# Patient Record
Sex: Female | Born: 1935 | Race: White | Hispanic: No | Marital: Married | Smoking: Never smoker
Health system: Southern US, Community
[De-identification: ages and names within clinical notes are randomized; demographics above are authoritative.]

## PROBLEM LIST (undated history)

## (undated) DIAGNOSIS — I509 Heart failure, unspecified: Secondary | ICD-10-CM

## (undated) DIAGNOSIS — E119 Type 2 diabetes mellitus without complications: Secondary | ICD-10-CM

## (undated) HISTORY — PX: CARDIAC SURGERY: SHX584

## (undated) HISTORY — PX: HIP SURGERY: SHX245

## (undated) HISTORY — PX: HERNIA REPAIR: SHX51

---

## 1998-04-07 ENCOUNTER — Inpatient Hospital Stay (HOSPITAL_COMMUNITY): Admission: RE | Admit: 1998-04-07 | Discharge: 1998-04-14 | Payer: Self-pay | Admitting: Neurological Surgery

## 1999-02-07 ENCOUNTER — Encounter (HOSPITAL_BASED_OUTPATIENT_CLINIC_OR_DEPARTMENT_OTHER): Payer: Self-pay | Admitting: General Surgery

## 1999-02-09 ENCOUNTER — Ambulatory Visit (HOSPITAL_COMMUNITY): Admission: RE | Admit: 1999-02-09 | Discharge: 1999-02-10 | Payer: Self-pay | Admitting: General Surgery

## 1999-05-22 ENCOUNTER — Other Ambulatory Visit: Admission: RE | Admit: 1999-05-22 | Discharge: 1999-05-22 | Payer: Self-pay | Admitting: Family Medicine

## 2001-09-11 ENCOUNTER — Other Ambulatory Visit: Admission: RE | Admit: 2001-09-11 | Discharge: 2001-09-11 | Payer: Self-pay | Admitting: Family Medicine

## 2008-01-11 ENCOUNTER — Emergency Department (HOSPITAL_COMMUNITY): Admission: EM | Admit: 2008-01-11 | Discharge: 2008-01-11 | Payer: Self-pay | Admitting: Emergency Medicine

## 2011-08-24 LAB — CBC
HCT: 35 — ABNORMAL LOW
Hemoglobin: 12.1
MCHC: 34.6
MCV: 93.2
Platelets: 283
RBC: 3.76 — ABNORMAL LOW
RDW: 12.2
WBC: 8.9

## 2011-08-24 LAB — DIFFERENTIAL
Basophils Absolute: 0
Basophils Relative: 0
Eosinophils Absolute: 0.2
Eosinophils Relative: 2
Lymphocytes Relative: 27
Lymphs Abs: 2.4
Monocytes Absolute: 0.7
Monocytes Relative: 8
Neutro Abs: 5.6
Neutrophils Relative %: 63

## 2011-08-24 LAB — PROTIME-INR
INR: 2.3 — ABNORMAL HIGH
Prothrombin Time: 26.2 — ABNORMAL HIGH

## 2014-12-07 DIAGNOSIS — Z7901 Long term (current) use of anticoagulants: Secondary | ICD-10-CM | POA: Diagnosis not present

## 2014-12-21 DIAGNOSIS — L820 Inflamed seborrheic keratosis: Secondary | ICD-10-CM | POA: Diagnosis not present

## 2014-12-21 DIAGNOSIS — Z85828 Personal history of other malignant neoplasm of skin: Secondary | ICD-10-CM | POA: Diagnosis not present

## 2014-12-21 DIAGNOSIS — L821 Other seborrheic keratosis: Secondary | ICD-10-CM | POA: Diagnosis not present

## 2014-12-21 DIAGNOSIS — L570 Actinic keratosis: Secondary | ICD-10-CM | POA: Diagnosis not present

## 2014-12-28 DIAGNOSIS — D649 Anemia, unspecified: Secondary | ICD-10-CM | POA: Diagnosis not present

## 2014-12-30 DIAGNOSIS — D649 Anemia, unspecified: Secondary | ICD-10-CM | POA: Diagnosis not present

## 2015-01-11 DIAGNOSIS — M109 Gout, unspecified: Secondary | ICD-10-CM | POA: Diagnosis not present

## 2015-01-11 DIAGNOSIS — N184 Chronic kidney disease, stage 4 (severe): Secondary | ICD-10-CM | POA: Diagnosis not present

## 2015-01-11 DIAGNOSIS — I10 Essential (primary) hypertension: Secondary | ICD-10-CM | POA: Diagnosis not present

## 2015-01-11 DIAGNOSIS — E782 Mixed hyperlipidemia: Secondary | ICD-10-CM | POA: Diagnosis not present

## 2015-01-11 DIAGNOSIS — Z7901 Long term (current) use of anticoagulants: Secondary | ICD-10-CM | POA: Diagnosis not present

## 2015-01-11 DIAGNOSIS — E1129 Type 2 diabetes mellitus with other diabetic kidney complication: Secondary | ICD-10-CM | POA: Diagnosis not present

## 2015-01-18 DIAGNOSIS — R791 Abnormal coagulation profile: Secondary | ICD-10-CM | POA: Diagnosis not present

## 2015-02-01 DIAGNOSIS — J069 Acute upper respiratory infection, unspecified: Secondary | ICD-10-CM | POA: Diagnosis not present

## 2015-02-01 DIAGNOSIS — E119 Type 2 diabetes mellitus without complications: Secondary | ICD-10-CM | POA: Diagnosis not present

## 2015-02-01 DIAGNOSIS — R791 Abnormal coagulation profile: Secondary | ICD-10-CM | POA: Diagnosis not present

## 2015-02-15 DIAGNOSIS — Z7901 Long term (current) use of anticoagulants: Secondary | ICD-10-CM | POA: Diagnosis not present

## 2015-03-01 DIAGNOSIS — H2703 Aphakia, bilateral: Secondary | ICD-10-CM | POA: Diagnosis not present

## 2015-03-01 DIAGNOSIS — Z7901 Long term (current) use of anticoagulants: Secondary | ICD-10-CM | POA: Diagnosis not present

## 2015-03-15 DIAGNOSIS — L600 Ingrowing nail: Secondary | ICD-10-CM | POA: Diagnosis not present

## 2015-03-15 DIAGNOSIS — M2040 Other hammer toe(s) (acquired), unspecified foot: Secondary | ICD-10-CM | POA: Diagnosis not present

## 2015-03-15 DIAGNOSIS — E1142 Type 2 diabetes mellitus with diabetic polyneuropathy: Secondary | ICD-10-CM | POA: Diagnosis not present

## 2015-03-25 DIAGNOSIS — H40013 Open angle with borderline findings, low risk, bilateral: Secondary | ICD-10-CM | POA: Diagnosis not present

## 2015-03-25 DIAGNOSIS — H26491 Other secondary cataract, right eye: Secondary | ICD-10-CM | POA: Diagnosis not present

## 2015-03-30 DIAGNOSIS — H2703 Aphakia, bilateral: Secondary | ICD-10-CM | POA: Diagnosis not present

## 2015-04-05 DIAGNOSIS — Z7901 Long term (current) use of anticoagulants: Secondary | ICD-10-CM | POA: Diagnosis not present

## 2015-04-05 DIAGNOSIS — N184 Chronic kidney disease, stage 4 (severe): Secondary | ICD-10-CM | POA: Diagnosis not present

## 2015-04-05 DIAGNOSIS — E782 Mixed hyperlipidemia: Secondary | ICD-10-CM | POA: Diagnosis not present

## 2015-04-05 DIAGNOSIS — Z1389 Encounter for screening for other disorder: Secondary | ICD-10-CM | POA: Diagnosis not present

## 2015-04-05 DIAGNOSIS — Z9181 History of falling: Secondary | ICD-10-CM | POA: Diagnosis not present

## 2015-04-05 DIAGNOSIS — I83029 Varicose veins of left lower extremity with ulcer of unspecified site: Secondary | ICD-10-CM | POA: Diagnosis not present

## 2015-04-05 DIAGNOSIS — M109 Gout, unspecified: Secondary | ICD-10-CM | POA: Diagnosis not present

## 2015-04-05 DIAGNOSIS — I10 Essential (primary) hypertension: Secondary | ICD-10-CM | POA: Diagnosis not present

## 2015-04-05 DIAGNOSIS — E1129 Type 2 diabetes mellitus with other diabetic kidney complication: Secondary | ICD-10-CM | POA: Diagnosis not present

## 2015-04-08 DIAGNOSIS — I83029 Varicose veins of left lower extremity with ulcer of unspecified site: Secondary | ICD-10-CM | POA: Diagnosis not present

## 2015-04-11 DIAGNOSIS — I4891 Unspecified atrial fibrillation: Secondary | ICD-10-CM | POA: Diagnosis not present

## 2015-04-13 DIAGNOSIS — Z6834 Body mass index (BMI) 34.0-34.9, adult: Secondary | ICD-10-CM | POA: Diagnosis not present

## 2015-04-13 DIAGNOSIS — I83029 Varicose veins of left lower extremity with ulcer of unspecified site: Secondary | ICD-10-CM | POA: Diagnosis not present

## 2015-04-18 DIAGNOSIS — I87303 Chronic venous hypertension (idiopathic) without complications of bilateral lower extremity: Secondary | ICD-10-CM | POA: Diagnosis not present

## 2015-04-18 DIAGNOSIS — I83029 Varicose veins of left lower extremity with ulcer of unspecified site: Secondary | ICD-10-CM | POA: Diagnosis not present

## 2015-04-19 DIAGNOSIS — L821 Other seborrheic keratosis: Secondary | ICD-10-CM | POA: Diagnosis not present

## 2015-04-19 DIAGNOSIS — L438 Other lichen planus: Secondary | ICD-10-CM | POA: Diagnosis not present

## 2015-04-19 DIAGNOSIS — L570 Actinic keratosis: Secondary | ICD-10-CM | POA: Diagnosis not present

## 2015-05-12 DIAGNOSIS — I83019 Varicose veins of right lower extremity with ulcer of unspecified site: Secondary | ICD-10-CM | POA: Diagnosis not present

## 2015-05-12 DIAGNOSIS — Z6833 Body mass index (BMI) 33.0-33.9, adult: Secondary | ICD-10-CM | POA: Diagnosis not present

## 2015-05-12 DIAGNOSIS — Z7901 Long term (current) use of anticoagulants: Secondary | ICD-10-CM | POA: Diagnosis not present

## 2015-06-14 DIAGNOSIS — Z7901 Long term (current) use of anticoagulants: Secondary | ICD-10-CM | POA: Diagnosis not present

## 2015-07-12 DIAGNOSIS — R791 Abnormal coagulation profile: Secondary | ICD-10-CM | POA: Diagnosis not present

## 2015-07-19 DIAGNOSIS — R791 Abnormal coagulation profile: Secondary | ICD-10-CM | POA: Diagnosis not present

## 2015-08-03 DIAGNOSIS — Z7901 Long term (current) use of anticoagulants: Secondary | ICD-10-CM | POA: Diagnosis not present

## 2015-08-09 DIAGNOSIS — E782 Mixed hyperlipidemia: Secondary | ICD-10-CM | POA: Diagnosis not present

## 2015-08-09 DIAGNOSIS — Z9181 History of falling: Secondary | ICD-10-CM | POA: Diagnosis not present

## 2015-08-09 DIAGNOSIS — M109 Gout, unspecified: Secondary | ICD-10-CM | POA: Diagnosis not present

## 2015-08-09 DIAGNOSIS — Z139 Encounter for screening, unspecified: Secondary | ICD-10-CM | POA: Diagnosis not present

## 2015-08-09 DIAGNOSIS — E1129 Type 2 diabetes mellitus with other diabetic kidney complication: Secondary | ICD-10-CM | POA: Diagnosis not present

## 2015-08-09 DIAGNOSIS — I10 Essential (primary) hypertension: Secondary | ICD-10-CM | POA: Diagnosis not present

## 2015-08-09 DIAGNOSIS — N184 Chronic kidney disease, stage 4 (severe): Secondary | ICD-10-CM | POA: Diagnosis not present

## 2015-08-09 DIAGNOSIS — Z1389 Encounter for screening for other disorder: Secondary | ICD-10-CM | POA: Diagnosis not present

## 2015-08-12 DIAGNOSIS — I83009 Varicose veins of unspecified lower extremity with ulcer of unspecified site: Secondary | ICD-10-CM | POA: Diagnosis not present

## 2015-08-12 DIAGNOSIS — S80821A Blister (nonthermal), right lower leg, initial encounter: Secondary | ICD-10-CM | POA: Diagnosis not present

## 2015-08-12 DIAGNOSIS — Z6835 Body mass index (BMI) 35.0-35.9, adult: Secondary | ICD-10-CM | POA: Diagnosis not present

## 2015-08-16 DIAGNOSIS — L98499 Non-pressure chronic ulcer of skin of other sites with unspecified severity: Secondary | ICD-10-CM | POA: Diagnosis not present

## 2015-08-16 DIAGNOSIS — I83009 Varicose veins of unspecified lower extremity with ulcer of unspecified site: Secondary | ICD-10-CM | POA: Diagnosis not present

## 2015-08-16 DIAGNOSIS — Z6835 Body mass index (BMI) 35.0-35.9, adult: Secondary | ICD-10-CM | POA: Diagnosis not present

## 2015-08-19 DIAGNOSIS — L98499 Non-pressure chronic ulcer of skin of other sites with unspecified severity: Secondary | ICD-10-CM | POA: Diagnosis not present

## 2015-08-19 DIAGNOSIS — I83009 Varicose veins of unspecified lower extremity with ulcer of unspecified site: Secondary | ICD-10-CM | POA: Diagnosis not present

## 2015-08-19 DIAGNOSIS — Z6835 Body mass index (BMI) 35.0-35.9, adult: Secondary | ICD-10-CM | POA: Diagnosis not present

## 2015-08-24 DIAGNOSIS — I83029 Varicose veins of left lower extremity with ulcer of unspecified site: Secondary | ICD-10-CM | POA: Diagnosis not present

## 2015-08-24 DIAGNOSIS — I83019 Varicose veins of right lower extremity with ulcer of unspecified site: Secondary | ICD-10-CM | POA: Diagnosis not present

## 2015-08-24 DIAGNOSIS — Z6835 Body mass index (BMI) 35.0-35.9, adult: Secondary | ICD-10-CM | POA: Diagnosis not present

## 2015-08-29 DIAGNOSIS — R269 Unspecified abnormalities of gait and mobility: Secondary | ICD-10-CM | POA: Diagnosis not present

## 2015-08-29 DIAGNOSIS — Z6835 Body mass index (BMI) 35.0-35.9, adult: Secondary | ICD-10-CM | POA: Diagnosis not present

## 2015-08-29 DIAGNOSIS — I83029 Varicose veins of left lower extremity with ulcer of unspecified site: Secondary | ICD-10-CM | POA: Diagnosis not present

## 2015-08-29 DIAGNOSIS — I83019 Varicose veins of right lower extremity with ulcer of unspecified site: Secondary | ICD-10-CM | POA: Diagnosis not present

## 2015-09-05 DIAGNOSIS — R791 Abnormal coagulation profile: Secondary | ICD-10-CM | POA: Diagnosis not present

## 2015-09-05 DIAGNOSIS — K625 Hemorrhage of anus and rectum: Secondary | ICD-10-CM | POA: Diagnosis not present

## 2015-09-05 DIAGNOSIS — D509 Iron deficiency anemia, unspecified: Secondary | ICD-10-CM | POA: Diagnosis not present

## 2015-09-05 DIAGNOSIS — I4891 Unspecified atrial fibrillation: Secondary | ICD-10-CM | POA: Diagnosis not present

## 2015-09-05 DIAGNOSIS — Z6835 Body mass index (BMI) 35.0-35.9, adult: Secondary | ICD-10-CM | POA: Diagnosis not present

## 2015-09-12 DIAGNOSIS — Z23 Encounter for immunization: Secondary | ICD-10-CM | POA: Diagnosis not present

## 2015-09-12 DIAGNOSIS — I4891 Unspecified atrial fibrillation: Secondary | ICD-10-CM | POA: Diagnosis not present

## 2015-09-12 DIAGNOSIS — R791 Abnormal coagulation profile: Secondary | ICD-10-CM | POA: Diagnosis not present

## 2015-09-12 DIAGNOSIS — D509 Iron deficiency anemia, unspecified: Secondary | ICD-10-CM | POA: Diagnosis not present

## 2015-09-12 DIAGNOSIS — K625 Hemorrhage of anus and rectum: Secondary | ICD-10-CM | POA: Diagnosis not present

## 2015-09-27 DIAGNOSIS — L97911 Non-pressure chronic ulcer of unspecified part of right lower leg limited to breakdown of skin: Secondary | ICD-10-CM | POA: Diagnosis not present

## 2015-09-27 DIAGNOSIS — R791 Abnormal coagulation profile: Secondary | ICD-10-CM | POA: Diagnosis not present

## 2015-10-11 DIAGNOSIS — R791 Abnormal coagulation profile: Secondary | ICD-10-CM | POA: Diagnosis not present

## 2015-10-17 DIAGNOSIS — I110 Hypertensive heart disease with heart failure: Secondary | ICD-10-CM | POA: Diagnosis not present

## 2015-10-17 DIAGNOSIS — I480 Paroxysmal atrial fibrillation: Secondary | ICD-10-CM | POA: Diagnosis not present

## 2015-10-17 DIAGNOSIS — I5032 Chronic diastolic (congestive) heart failure: Secondary | ICD-10-CM | POA: Diagnosis not present

## 2015-10-17 DIAGNOSIS — N183 Chronic kidney disease, stage 3 (moderate): Secondary | ICD-10-CM | POA: Diagnosis not present

## 2015-10-17 DIAGNOSIS — Z7901 Long term (current) use of anticoagulants: Secondary | ICD-10-CM | POA: Diagnosis not present

## 2015-10-26 DIAGNOSIS — R791 Abnormal coagulation profile: Secondary | ICD-10-CM | POA: Diagnosis not present

## 2015-10-26 DIAGNOSIS — L97929 Non-pressure chronic ulcer of unspecified part of left lower leg with unspecified severity: Secondary | ICD-10-CM | POA: Diagnosis not present

## 2015-10-26 DIAGNOSIS — I87303 Chronic venous hypertension (idiopathic) without complications of bilateral lower extremity: Secondary | ICD-10-CM | POA: Diagnosis not present

## 2015-10-26 DIAGNOSIS — L97919 Non-pressure chronic ulcer of unspecified part of right lower leg with unspecified severity: Secondary | ICD-10-CM | POA: Diagnosis not present

## 2015-10-31 DIAGNOSIS — D044 Carcinoma in situ of skin of scalp and neck: Secondary | ICD-10-CM | POA: Diagnosis not present

## 2015-10-31 DIAGNOSIS — L570 Actinic keratosis: Secondary | ICD-10-CM | POA: Diagnosis not present

## 2015-10-31 DIAGNOSIS — D0439 Carcinoma in situ of skin of other parts of face: Secondary | ICD-10-CM | POA: Diagnosis not present

## 2015-11-02 DIAGNOSIS — Z6834 Body mass index (BMI) 34.0-34.9, adult: Secondary | ICD-10-CM | POA: Diagnosis not present

## 2015-11-02 DIAGNOSIS — I87303 Chronic venous hypertension (idiopathic) without complications of bilateral lower extremity: Secondary | ICD-10-CM | POA: Diagnosis not present

## 2015-11-02 DIAGNOSIS — E1129 Type 2 diabetes mellitus with other diabetic kidney complication: Secondary | ICD-10-CM | POA: Diagnosis not present

## 2015-11-02 DIAGNOSIS — L97929 Non-pressure chronic ulcer of unspecified part of left lower leg with unspecified severity: Secondary | ICD-10-CM | POA: Diagnosis not present

## 2015-11-09 DIAGNOSIS — S0083XA Contusion of other part of head, initial encounter: Secondary | ICD-10-CM | POA: Diagnosis not present

## 2015-11-09 DIAGNOSIS — L97929 Non-pressure chronic ulcer of unspecified part of left lower leg with unspecified severity: Secondary | ICD-10-CM | POA: Diagnosis not present

## 2015-11-09 DIAGNOSIS — R51 Headache: Secondary | ICD-10-CM | POA: Diagnosis not present

## 2015-11-09 DIAGNOSIS — S0990XA Unspecified injury of head, initial encounter: Secondary | ICD-10-CM | POA: Diagnosis not present

## 2015-11-09 DIAGNOSIS — S0181XA Laceration without foreign body of other part of head, initial encounter: Secondary | ICD-10-CM | POA: Diagnosis not present

## 2015-11-09 DIAGNOSIS — Z6834 Body mass index (BMI) 34.0-34.9, adult: Secondary | ICD-10-CM | POA: Diagnosis not present

## 2015-11-09 DIAGNOSIS — L97911 Non-pressure chronic ulcer of unspecified part of right lower leg limited to breakdown of skin: Secondary | ICD-10-CM | POA: Diagnosis not present

## 2015-11-09 DIAGNOSIS — S2231XA Fracture of one rib, right side, initial encounter for closed fracture: Secondary | ICD-10-CM | POA: Diagnosis not present

## 2015-11-09 DIAGNOSIS — S8992XA Unspecified injury of left lower leg, initial encounter: Secondary | ICD-10-CM | POA: Diagnosis not present

## 2015-11-15 DIAGNOSIS — S2231XA Fracture of one rib, right side, initial encounter for closed fracture: Secondary | ICD-10-CM | POA: Diagnosis not present

## 2015-11-15 DIAGNOSIS — S8001XA Contusion of right knee, initial encounter: Secondary | ICD-10-CM | POA: Diagnosis not present

## 2015-11-15 DIAGNOSIS — S0181XA Laceration without foreign body of other part of head, initial encounter: Secondary | ICD-10-CM | POA: Diagnosis not present

## 2015-11-18 DIAGNOSIS — S0181XA Laceration without foreign body of other part of head, initial encounter: Secondary | ICD-10-CM | POA: Diagnosis not present

## 2015-11-18 DIAGNOSIS — R791 Abnormal coagulation profile: Secondary | ICD-10-CM | POA: Diagnosis not present

## 2015-11-18 DIAGNOSIS — H578 Other specified disorders of eye and adnexa: Secondary | ICD-10-CM | POA: Diagnosis not present

## 2015-11-18 DIAGNOSIS — S2231XA Fracture of one rib, right side, initial encounter for closed fracture: Secondary | ICD-10-CM | POA: Diagnosis not present

## 2015-11-25 DIAGNOSIS — Z7901 Long term (current) use of anticoagulants: Secondary | ICD-10-CM | POA: Diagnosis not present

## 2015-12-09 DIAGNOSIS — I87303 Chronic venous hypertension (idiopathic) without complications of bilateral lower extremity: Secondary | ICD-10-CM | POA: Diagnosis not present

## 2015-12-09 DIAGNOSIS — I83009 Varicose veins of unspecified lower extremity with ulcer of unspecified site: Secondary | ICD-10-CM | POA: Diagnosis not present

## 2015-12-16 DIAGNOSIS — E1129 Type 2 diabetes mellitus with other diabetic kidney complication: Secondary | ICD-10-CM | POA: Diagnosis not present

## 2015-12-16 DIAGNOSIS — I83009 Varicose veins of unspecified lower extremity with ulcer of unspecified site: Secondary | ICD-10-CM | POA: Diagnosis not present

## 2015-12-23 DIAGNOSIS — L03116 Cellulitis of left lower limb: Secondary | ICD-10-CM | POA: Diagnosis not present

## 2015-12-23 DIAGNOSIS — I83009 Varicose veins of unspecified lower extremity with ulcer of unspecified site: Secondary | ICD-10-CM | POA: Diagnosis not present

## 2015-12-23 DIAGNOSIS — I87303 Chronic venous hypertension (idiopathic) without complications of bilateral lower extremity: Secondary | ICD-10-CM | POA: Diagnosis not present

## 2015-12-23 DIAGNOSIS — E669 Obesity, unspecified: Secondary | ICD-10-CM | POA: Diagnosis not present

## 2015-12-28 DIAGNOSIS — E1129 Type 2 diabetes mellitus with other diabetic kidney complication: Secondary | ICD-10-CM | POA: Diagnosis not present

## 2015-12-28 DIAGNOSIS — E782 Mixed hyperlipidemia: Secondary | ICD-10-CM | POA: Diagnosis not present

## 2015-12-28 DIAGNOSIS — Z79899 Other long term (current) drug therapy: Secondary | ICD-10-CM | POA: Diagnosis not present

## 2015-12-28 DIAGNOSIS — Z7901 Long term (current) use of anticoagulants: Secondary | ICD-10-CM | POA: Diagnosis not present

## 2015-12-28 DIAGNOSIS — M109 Gout, unspecified: Secondary | ICD-10-CM | POA: Diagnosis not present

## 2015-12-30 DIAGNOSIS — E782 Mixed hyperlipidemia: Secondary | ICD-10-CM | POA: Diagnosis not present

## 2015-12-30 DIAGNOSIS — I10 Essential (primary) hypertension: Secondary | ICD-10-CM | POA: Diagnosis not present

## 2015-12-30 DIAGNOSIS — E1129 Type 2 diabetes mellitus with other diabetic kidney complication: Secondary | ICD-10-CM | POA: Diagnosis not present

## 2015-12-30 DIAGNOSIS — I83009 Varicose veins of unspecified lower extremity with ulcer of unspecified site: Secondary | ICD-10-CM | POA: Diagnosis not present

## 2015-12-30 DIAGNOSIS — N183 Chronic kidney disease, stage 3 (moderate): Secondary | ICD-10-CM | POA: Diagnosis not present

## 2016-01-06 DIAGNOSIS — I83009 Varicose veins of unspecified lower extremity with ulcer of unspecified site: Secondary | ICD-10-CM | POA: Diagnosis not present

## 2016-01-09 DIAGNOSIS — I83009 Varicose veins of unspecified lower extremity with ulcer of unspecified site: Secondary | ICD-10-CM | POA: Diagnosis not present

## 2016-01-16 DIAGNOSIS — I87303 Chronic venous hypertension (idiopathic) without complications of bilateral lower extremity: Secondary | ICD-10-CM | POA: Diagnosis not present

## 2016-01-16 DIAGNOSIS — L97911 Non-pressure chronic ulcer of unspecified part of right lower leg limited to breakdown of skin: Secondary | ICD-10-CM | POA: Diagnosis not present

## 2016-01-23 DIAGNOSIS — I83009 Varicose veins of unspecified lower extremity with ulcer of unspecified site: Secondary | ICD-10-CM | POA: Diagnosis not present

## 2016-01-23 DIAGNOSIS — I87303 Chronic venous hypertension (idiopathic) without complications of bilateral lower extremity: Secondary | ICD-10-CM | POA: Diagnosis not present

## 2016-01-31 DIAGNOSIS — R791 Abnormal coagulation profile: Secondary | ICD-10-CM | POA: Diagnosis not present

## 2016-01-31 DIAGNOSIS — I87303 Chronic venous hypertension (idiopathic) without complications of bilateral lower extremity: Secondary | ICD-10-CM | POA: Diagnosis not present

## 2016-02-02 DIAGNOSIS — E119 Type 2 diabetes mellitus without complications: Secondary | ICD-10-CM | POA: Diagnosis not present

## 2016-02-03 DIAGNOSIS — I83009 Varicose veins of unspecified lower extremity with ulcer of unspecified site: Secondary | ICD-10-CM | POA: Diagnosis not present

## 2016-02-03 DIAGNOSIS — I87303 Chronic venous hypertension (idiopathic) without complications of bilateral lower extremity: Secondary | ICD-10-CM | POA: Diagnosis not present

## 2016-02-06 DIAGNOSIS — I87303 Chronic venous hypertension (idiopathic) without complications of bilateral lower extremity: Secondary | ICD-10-CM | POA: Diagnosis not present

## 2016-02-06 DIAGNOSIS — I83009 Varicose veins of unspecified lower extremity with ulcer of unspecified site: Secondary | ICD-10-CM | POA: Diagnosis not present

## 2016-02-06 DIAGNOSIS — M25462 Effusion, left knee: Secondary | ICD-10-CM | POA: Diagnosis not present

## 2016-02-06 DIAGNOSIS — M25561 Pain in right knee: Secondary | ICD-10-CM | POA: Diagnosis not present

## 2016-02-06 DIAGNOSIS — M25461 Effusion, right knee: Secondary | ICD-10-CM | POA: Diagnosis not present

## 2016-02-06 DIAGNOSIS — W19XXXA Unspecified fall, initial encounter: Secondary | ICD-10-CM | POA: Diagnosis not present

## 2016-02-06 DIAGNOSIS — M170 Bilateral primary osteoarthritis of knee: Secondary | ICD-10-CM | POA: Diagnosis not present

## 2016-02-10 DIAGNOSIS — I87303 Chronic venous hypertension (idiopathic) without complications of bilateral lower extremity: Secondary | ICD-10-CM | POA: Diagnosis not present

## 2016-02-10 DIAGNOSIS — Z9181 History of falling: Secondary | ICD-10-CM | POA: Diagnosis not present

## 2016-02-10 DIAGNOSIS — Z952 Presence of prosthetic heart valve: Secondary | ICD-10-CM | POA: Diagnosis not present

## 2016-02-10 DIAGNOSIS — E1122 Type 2 diabetes mellitus with diabetic chronic kidney disease: Secondary | ICD-10-CM | POA: Diagnosis not present

## 2016-02-10 DIAGNOSIS — I130 Hypertensive heart and chronic kidney disease with heart failure and stage 1 through stage 4 chronic kidney disease, or unspecified chronic kidney disease: Secondary | ICD-10-CM | POA: Diagnosis not present

## 2016-02-10 DIAGNOSIS — I83218 Varicose veins of right lower extremity with both ulcer of other part of lower extremity and inflammation: Secondary | ICD-10-CM | POA: Diagnosis not present

## 2016-02-10 DIAGNOSIS — Z7901 Long term (current) use of anticoagulants: Secondary | ICD-10-CM | POA: Diagnosis not present

## 2016-02-10 DIAGNOSIS — L97812 Non-pressure chronic ulcer of other part of right lower leg with fat layer exposed: Secondary | ICD-10-CM | POA: Diagnosis not present

## 2016-02-10 DIAGNOSIS — E1143 Type 2 diabetes mellitus with diabetic autonomic (poly)neuropathy: Secondary | ICD-10-CM | POA: Diagnosis not present

## 2016-02-10 DIAGNOSIS — I83009 Varicose veins of unspecified lower extremity with ulcer of unspecified site: Secondary | ICD-10-CM | POA: Diagnosis not present

## 2016-02-10 DIAGNOSIS — I2510 Atherosclerotic heart disease of native coronary artery without angina pectoris: Secondary | ICD-10-CM | POA: Diagnosis not present

## 2016-02-10 DIAGNOSIS — L97822 Non-pressure chronic ulcer of other part of left lower leg with fat layer exposed: Secondary | ICD-10-CM | POA: Diagnosis not present

## 2016-02-10 DIAGNOSIS — N183 Chronic kidney disease, stage 3 (moderate): Secondary | ICD-10-CM | POA: Diagnosis not present

## 2016-02-10 DIAGNOSIS — I482 Chronic atrial fibrillation: Secondary | ICD-10-CM | POA: Diagnosis not present

## 2016-02-10 DIAGNOSIS — I83228 Varicose veins of left lower extremity with both ulcer of other part of lower extremity and inflammation: Secondary | ICD-10-CM | POA: Diagnosis not present

## 2016-02-10 DIAGNOSIS — I5022 Chronic systolic (congestive) heart failure: Secondary | ICD-10-CM | POA: Diagnosis not present

## 2016-02-14 DIAGNOSIS — I5022 Chronic systolic (congestive) heart failure: Secondary | ICD-10-CM | POA: Diagnosis not present

## 2016-02-14 DIAGNOSIS — I482 Chronic atrial fibrillation: Secondary | ICD-10-CM | POA: Diagnosis not present

## 2016-02-14 DIAGNOSIS — I83218 Varicose veins of right lower extremity with both ulcer of other part of lower extremity and inflammation: Secondary | ICD-10-CM | POA: Diagnosis not present

## 2016-02-14 DIAGNOSIS — L97812 Non-pressure chronic ulcer of other part of right lower leg with fat layer exposed: Secondary | ICD-10-CM | POA: Diagnosis not present

## 2016-02-14 DIAGNOSIS — I2510 Atherosclerotic heart disease of native coronary artery without angina pectoris: Secondary | ICD-10-CM | POA: Diagnosis not present

## 2016-02-14 DIAGNOSIS — I83228 Varicose veins of left lower extremity with both ulcer of other part of lower extremity and inflammation: Secondary | ICD-10-CM | POA: Diagnosis not present

## 2016-02-14 DIAGNOSIS — E1122 Type 2 diabetes mellitus with diabetic chronic kidney disease: Secondary | ICD-10-CM | POA: Diagnosis not present

## 2016-02-14 DIAGNOSIS — L97822 Non-pressure chronic ulcer of other part of left lower leg with fat layer exposed: Secondary | ICD-10-CM | POA: Diagnosis not present

## 2016-02-14 DIAGNOSIS — I130 Hypertensive heart and chronic kidney disease with heart failure and stage 1 through stage 4 chronic kidney disease, or unspecified chronic kidney disease: Secondary | ICD-10-CM | POA: Diagnosis not present

## 2016-02-14 DIAGNOSIS — Z7901 Long term (current) use of anticoagulants: Secondary | ICD-10-CM | POA: Diagnosis not present

## 2016-02-14 DIAGNOSIS — Z9181 History of falling: Secondary | ICD-10-CM | POA: Diagnosis not present

## 2016-02-14 DIAGNOSIS — E1143 Type 2 diabetes mellitus with diabetic autonomic (poly)neuropathy: Secondary | ICD-10-CM | POA: Diagnosis not present

## 2016-02-14 DIAGNOSIS — N183 Chronic kidney disease, stage 3 (moderate): Secondary | ICD-10-CM | POA: Diagnosis not present

## 2016-02-14 DIAGNOSIS — Z952 Presence of prosthetic heart valve: Secondary | ICD-10-CM | POA: Diagnosis not present

## 2016-02-15 DIAGNOSIS — Z7901 Long term (current) use of anticoagulants: Secondary | ICD-10-CM | POA: Diagnosis not present

## 2016-02-16 DIAGNOSIS — E1143 Type 2 diabetes mellitus with diabetic autonomic (poly)neuropathy: Secondary | ICD-10-CM | POA: Diagnosis not present

## 2016-02-16 DIAGNOSIS — I83228 Varicose veins of left lower extremity with both ulcer of other part of lower extremity and inflammation: Secondary | ICD-10-CM | POA: Diagnosis not present

## 2016-02-16 DIAGNOSIS — E1122 Type 2 diabetes mellitus with diabetic chronic kidney disease: Secondary | ICD-10-CM | POA: Diagnosis not present

## 2016-02-16 DIAGNOSIS — L97822 Non-pressure chronic ulcer of other part of left lower leg with fat layer exposed: Secondary | ICD-10-CM | POA: Diagnosis not present

## 2016-02-16 DIAGNOSIS — Z952 Presence of prosthetic heart valve: Secondary | ICD-10-CM | POA: Diagnosis not present

## 2016-02-16 DIAGNOSIS — I83218 Varicose veins of right lower extremity with both ulcer of other part of lower extremity and inflammation: Secondary | ICD-10-CM | POA: Diagnosis not present

## 2016-02-16 DIAGNOSIS — Z7901 Long term (current) use of anticoagulants: Secondary | ICD-10-CM | POA: Diagnosis not present

## 2016-02-16 DIAGNOSIS — L97812 Non-pressure chronic ulcer of other part of right lower leg with fat layer exposed: Secondary | ICD-10-CM | POA: Diagnosis not present

## 2016-02-16 DIAGNOSIS — I482 Chronic atrial fibrillation: Secondary | ICD-10-CM | POA: Diagnosis not present

## 2016-02-16 DIAGNOSIS — I5022 Chronic systolic (congestive) heart failure: Secondary | ICD-10-CM | POA: Diagnosis not present

## 2016-02-16 DIAGNOSIS — N183 Chronic kidney disease, stage 3 (moderate): Secondary | ICD-10-CM | POA: Diagnosis not present

## 2016-02-16 DIAGNOSIS — I2510 Atherosclerotic heart disease of native coronary artery without angina pectoris: Secondary | ICD-10-CM | POA: Diagnosis not present

## 2016-02-16 DIAGNOSIS — I130 Hypertensive heart and chronic kidney disease with heart failure and stage 1 through stage 4 chronic kidney disease, or unspecified chronic kidney disease: Secondary | ICD-10-CM | POA: Diagnosis not present

## 2016-02-16 DIAGNOSIS — Z9181 History of falling: Secondary | ICD-10-CM | POA: Diagnosis not present

## 2016-02-21 DIAGNOSIS — N183 Chronic kidney disease, stage 3 (moderate): Secondary | ICD-10-CM | POA: Diagnosis not present

## 2016-02-21 DIAGNOSIS — I482 Chronic atrial fibrillation: Secondary | ICD-10-CM | POA: Diagnosis not present

## 2016-02-21 DIAGNOSIS — I83228 Varicose veins of left lower extremity with both ulcer of other part of lower extremity and inflammation: Secondary | ICD-10-CM | POA: Diagnosis not present

## 2016-02-21 DIAGNOSIS — L97812 Non-pressure chronic ulcer of other part of right lower leg with fat layer exposed: Secondary | ICD-10-CM | POA: Diagnosis not present

## 2016-02-21 DIAGNOSIS — Z7901 Long term (current) use of anticoagulants: Secondary | ICD-10-CM | POA: Diagnosis not present

## 2016-02-21 DIAGNOSIS — I83218 Varicose veins of right lower extremity with both ulcer of other part of lower extremity and inflammation: Secondary | ICD-10-CM | POA: Diagnosis not present

## 2016-02-21 DIAGNOSIS — E1122 Type 2 diabetes mellitus with diabetic chronic kidney disease: Secondary | ICD-10-CM | POA: Diagnosis not present

## 2016-02-21 DIAGNOSIS — I130 Hypertensive heart and chronic kidney disease with heart failure and stage 1 through stage 4 chronic kidney disease, or unspecified chronic kidney disease: Secondary | ICD-10-CM | POA: Diagnosis not present

## 2016-02-21 DIAGNOSIS — Z9181 History of falling: Secondary | ICD-10-CM | POA: Diagnosis not present

## 2016-02-21 DIAGNOSIS — Z952 Presence of prosthetic heart valve: Secondary | ICD-10-CM | POA: Diagnosis not present

## 2016-02-21 DIAGNOSIS — E1143 Type 2 diabetes mellitus with diabetic autonomic (poly)neuropathy: Secondary | ICD-10-CM | POA: Diagnosis not present

## 2016-02-21 DIAGNOSIS — I5022 Chronic systolic (congestive) heart failure: Secondary | ICD-10-CM | POA: Diagnosis not present

## 2016-02-21 DIAGNOSIS — I2510 Atherosclerotic heart disease of native coronary artery without angina pectoris: Secondary | ICD-10-CM | POA: Diagnosis not present

## 2016-02-21 DIAGNOSIS — L97822 Non-pressure chronic ulcer of other part of left lower leg with fat layer exposed: Secondary | ICD-10-CM | POA: Diagnosis not present

## 2016-02-24 DIAGNOSIS — I482 Chronic atrial fibrillation: Secondary | ICD-10-CM | POA: Diagnosis not present

## 2016-02-24 DIAGNOSIS — I83218 Varicose veins of right lower extremity with both ulcer of other part of lower extremity and inflammation: Secondary | ICD-10-CM | POA: Diagnosis not present

## 2016-02-24 DIAGNOSIS — I2510 Atherosclerotic heart disease of native coronary artery without angina pectoris: Secondary | ICD-10-CM | POA: Diagnosis not present

## 2016-02-24 DIAGNOSIS — Z952 Presence of prosthetic heart valve: Secondary | ICD-10-CM | POA: Diagnosis not present

## 2016-02-24 DIAGNOSIS — I5022 Chronic systolic (congestive) heart failure: Secondary | ICD-10-CM | POA: Diagnosis not present

## 2016-02-24 DIAGNOSIS — L97822 Non-pressure chronic ulcer of other part of left lower leg with fat layer exposed: Secondary | ICD-10-CM | POA: Diagnosis not present

## 2016-02-24 DIAGNOSIS — I83228 Varicose veins of left lower extremity with both ulcer of other part of lower extremity and inflammation: Secondary | ICD-10-CM | POA: Diagnosis not present

## 2016-02-24 DIAGNOSIS — E1143 Type 2 diabetes mellitus with diabetic autonomic (poly)neuropathy: Secondary | ICD-10-CM | POA: Diagnosis not present

## 2016-02-24 DIAGNOSIS — Z7901 Long term (current) use of anticoagulants: Secondary | ICD-10-CM | POA: Diagnosis not present

## 2016-02-24 DIAGNOSIS — Z9181 History of falling: Secondary | ICD-10-CM | POA: Diagnosis not present

## 2016-02-24 DIAGNOSIS — N183 Chronic kidney disease, stage 3 (moderate): Secondary | ICD-10-CM | POA: Diagnosis not present

## 2016-02-24 DIAGNOSIS — E1122 Type 2 diabetes mellitus with diabetic chronic kidney disease: Secondary | ICD-10-CM | POA: Diagnosis not present

## 2016-02-24 DIAGNOSIS — I130 Hypertensive heart and chronic kidney disease with heart failure and stage 1 through stage 4 chronic kidney disease, or unspecified chronic kidney disease: Secondary | ICD-10-CM | POA: Diagnosis not present

## 2016-02-24 DIAGNOSIS — L97812 Non-pressure chronic ulcer of other part of right lower leg with fat layer exposed: Secondary | ICD-10-CM | POA: Diagnosis not present

## 2016-02-29 DIAGNOSIS — L97812 Non-pressure chronic ulcer of other part of right lower leg with fat layer exposed: Secondary | ICD-10-CM | POA: Diagnosis not present

## 2016-02-29 DIAGNOSIS — Z9181 History of falling: Secondary | ICD-10-CM | POA: Diagnosis not present

## 2016-02-29 DIAGNOSIS — Z7901 Long term (current) use of anticoagulants: Secondary | ICD-10-CM | POA: Diagnosis not present

## 2016-02-29 DIAGNOSIS — I482 Chronic atrial fibrillation: Secondary | ICD-10-CM | POA: Diagnosis not present

## 2016-02-29 DIAGNOSIS — I83228 Varicose veins of left lower extremity with both ulcer of other part of lower extremity and inflammation: Secondary | ICD-10-CM | POA: Diagnosis not present

## 2016-02-29 DIAGNOSIS — L97822 Non-pressure chronic ulcer of other part of left lower leg with fat layer exposed: Secondary | ICD-10-CM | POA: Diagnosis not present

## 2016-02-29 DIAGNOSIS — I5022 Chronic systolic (congestive) heart failure: Secondary | ICD-10-CM | POA: Diagnosis not present

## 2016-02-29 DIAGNOSIS — E1143 Type 2 diabetes mellitus with diabetic autonomic (poly)neuropathy: Secondary | ICD-10-CM | POA: Diagnosis not present

## 2016-02-29 DIAGNOSIS — N183 Chronic kidney disease, stage 3 (moderate): Secondary | ICD-10-CM | POA: Diagnosis not present

## 2016-02-29 DIAGNOSIS — I2510 Atherosclerotic heart disease of native coronary artery without angina pectoris: Secondary | ICD-10-CM | POA: Diagnosis not present

## 2016-02-29 DIAGNOSIS — I130 Hypertensive heart and chronic kidney disease with heart failure and stage 1 through stage 4 chronic kidney disease, or unspecified chronic kidney disease: Secondary | ICD-10-CM | POA: Diagnosis not present

## 2016-02-29 DIAGNOSIS — E1122 Type 2 diabetes mellitus with diabetic chronic kidney disease: Secondary | ICD-10-CM | POA: Diagnosis not present

## 2016-02-29 DIAGNOSIS — Z952 Presence of prosthetic heart valve: Secondary | ICD-10-CM | POA: Diagnosis not present

## 2016-02-29 DIAGNOSIS — I83218 Varicose veins of right lower extremity with both ulcer of other part of lower extremity and inflammation: Secondary | ICD-10-CM | POA: Diagnosis not present

## 2016-03-02 DIAGNOSIS — M79604 Pain in right leg: Secondary | ICD-10-CM | POA: Diagnosis not present

## 2016-03-02 DIAGNOSIS — I890 Lymphedema, not elsewhere classified: Secondary | ICD-10-CM | POA: Diagnosis not present

## 2016-03-05 DIAGNOSIS — I5022 Chronic systolic (congestive) heart failure: Secondary | ICD-10-CM | POA: Diagnosis not present

## 2016-03-05 DIAGNOSIS — E1143 Type 2 diabetes mellitus with diabetic autonomic (poly)neuropathy: Secondary | ICD-10-CM | POA: Diagnosis not present

## 2016-03-05 DIAGNOSIS — I83228 Varicose veins of left lower extremity with both ulcer of other part of lower extremity and inflammation: Secondary | ICD-10-CM | POA: Diagnosis not present

## 2016-03-05 DIAGNOSIS — L97822 Non-pressure chronic ulcer of other part of left lower leg with fat layer exposed: Secondary | ICD-10-CM | POA: Diagnosis not present

## 2016-03-05 DIAGNOSIS — Z952 Presence of prosthetic heart valve: Secondary | ICD-10-CM | POA: Diagnosis not present

## 2016-03-05 DIAGNOSIS — I83218 Varicose veins of right lower extremity with both ulcer of other part of lower extremity and inflammation: Secondary | ICD-10-CM | POA: Diagnosis not present

## 2016-03-05 DIAGNOSIS — I482 Chronic atrial fibrillation: Secondary | ICD-10-CM | POA: Diagnosis not present

## 2016-03-05 DIAGNOSIS — I130 Hypertensive heart and chronic kidney disease with heart failure and stage 1 through stage 4 chronic kidney disease, or unspecified chronic kidney disease: Secondary | ICD-10-CM | POA: Diagnosis not present

## 2016-03-05 DIAGNOSIS — E1122 Type 2 diabetes mellitus with diabetic chronic kidney disease: Secondary | ICD-10-CM | POA: Diagnosis not present

## 2016-03-05 DIAGNOSIS — N183 Chronic kidney disease, stage 3 (moderate): Secondary | ICD-10-CM | POA: Diagnosis not present

## 2016-03-05 DIAGNOSIS — I2510 Atherosclerotic heart disease of native coronary artery without angina pectoris: Secondary | ICD-10-CM | POA: Diagnosis not present

## 2016-03-05 DIAGNOSIS — Z7901 Long term (current) use of anticoagulants: Secondary | ICD-10-CM | POA: Diagnosis not present

## 2016-03-05 DIAGNOSIS — Z9181 History of falling: Secondary | ICD-10-CM | POA: Diagnosis not present

## 2016-03-05 DIAGNOSIS — L97812 Non-pressure chronic ulcer of other part of right lower leg with fat layer exposed: Secondary | ICD-10-CM | POA: Diagnosis not present

## 2016-03-08 DIAGNOSIS — Z9181 History of falling: Secondary | ICD-10-CM | POA: Diagnosis not present

## 2016-03-08 DIAGNOSIS — N183 Chronic kidney disease, stage 3 (moderate): Secondary | ICD-10-CM | POA: Diagnosis not present

## 2016-03-08 DIAGNOSIS — I2510 Atherosclerotic heart disease of native coronary artery without angina pectoris: Secondary | ICD-10-CM | POA: Diagnosis not present

## 2016-03-08 DIAGNOSIS — Z7901 Long term (current) use of anticoagulants: Secondary | ICD-10-CM | POA: Diagnosis not present

## 2016-03-08 DIAGNOSIS — I482 Chronic atrial fibrillation: Secondary | ICD-10-CM | POA: Diagnosis not present

## 2016-03-08 DIAGNOSIS — E1122 Type 2 diabetes mellitus with diabetic chronic kidney disease: Secondary | ICD-10-CM | POA: Diagnosis not present

## 2016-03-08 DIAGNOSIS — L97812 Non-pressure chronic ulcer of other part of right lower leg with fat layer exposed: Secondary | ICD-10-CM | POA: Diagnosis not present

## 2016-03-08 DIAGNOSIS — I83228 Varicose veins of left lower extremity with both ulcer of other part of lower extremity and inflammation: Secondary | ICD-10-CM | POA: Diagnosis not present

## 2016-03-08 DIAGNOSIS — L97822 Non-pressure chronic ulcer of other part of left lower leg with fat layer exposed: Secondary | ICD-10-CM | POA: Diagnosis not present

## 2016-03-08 DIAGNOSIS — I130 Hypertensive heart and chronic kidney disease with heart failure and stage 1 through stage 4 chronic kidney disease, or unspecified chronic kidney disease: Secondary | ICD-10-CM | POA: Diagnosis not present

## 2016-03-08 DIAGNOSIS — E1143 Type 2 diabetes mellitus with diabetic autonomic (poly)neuropathy: Secondary | ICD-10-CM | POA: Diagnosis not present

## 2016-03-08 DIAGNOSIS — I83218 Varicose veins of right lower extremity with both ulcer of other part of lower extremity and inflammation: Secondary | ICD-10-CM | POA: Diagnosis not present

## 2016-03-08 DIAGNOSIS — I5022 Chronic systolic (congestive) heart failure: Secondary | ICD-10-CM | POA: Diagnosis not present

## 2016-03-08 DIAGNOSIS — Z952 Presence of prosthetic heart valve: Secondary | ICD-10-CM | POA: Diagnosis not present

## 2016-03-14 DIAGNOSIS — I5022 Chronic systolic (congestive) heart failure: Secondary | ICD-10-CM | POA: Diagnosis not present

## 2016-03-14 DIAGNOSIS — Z7901 Long term (current) use of anticoagulants: Secondary | ICD-10-CM | POA: Diagnosis not present

## 2016-03-14 DIAGNOSIS — E1143 Type 2 diabetes mellitus with diabetic autonomic (poly)neuropathy: Secondary | ICD-10-CM | POA: Diagnosis not present

## 2016-03-14 DIAGNOSIS — Z9181 History of falling: Secondary | ICD-10-CM | POA: Diagnosis not present

## 2016-03-14 DIAGNOSIS — E1122 Type 2 diabetes mellitus with diabetic chronic kidney disease: Secondary | ICD-10-CM | POA: Diagnosis not present

## 2016-03-14 DIAGNOSIS — N183 Chronic kidney disease, stage 3 (moderate): Secondary | ICD-10-CM | POA: Diagnosis not present

## 2016-03-14 DIAGNOSIS — I2510 Atherosclerotic heart disease of native coronary artery without angina pectoris: Secondary | ICD-10-CM | POA: Diagnosis not present

## 2016-03-14 DIAGNOSIS — L97822 Non-pressure chronic ulcer of other part of left lower leg with fat layer exposed: Secondary | ICD-10-CM | POA: Diagnosis not present

## 2016-03-14 DIAGNOSIS — I482 Chronic atrial fibrillation: Secondary | ICD-10-CM | POA: Diagnosis not present

## 2016-03-14 DIAGNOSIS — I83218 Varicose veins of right lower extremity with both ulcer of other part of lower extremity and inflammation: Secondary | ICD-10-CM | POA: Diagnosis not present

## 2016-03-14 DIAGNOSIS — I130 Hypertensive heart and chronic kidney disease with heart failure and stage 1 through stage 4 chronic kidney disease, or unspecified chronic kidney disease: Secondary | ICD-10-CM | POA: Diagnosis not present

## 2016-03-14 DIAGNOSIS — L97812 Non-pressure chronic ulcer of other part of right lower leg with fat layer exposed: Secondary | ICD-10-CM | POA: Diagnosis not present

## 2016-03-14 DIAGNOSIS — I83228 Varicose veins of left lower extremity with both ulcer of other part of lower extremity and inflammation: Secondary | ICD-10-CM | POA: Diagnosis not present

## 2016-03-14 DIAGNOSIS — Z952 Presence of prosthetic heart valve: Secondary | ICD-10-CM | POA: Diagnosis not present

## 2016-03-21 DIAGNOSIS — I5022 Chronic systolic (congestive) heart failure: Secondary | ICD-10-CM | POA: Diagnosis not present

## 2016-03-21 DIAGNOSIS — I83228 Varicose veins of left lower extremity with both ulcer of other part of lower extremity and inflammation: Secondary | ICD-10-CM | POA: Diagnosis not present

## 2016-03-21 DIAGNOSIS — I83218 Varicose veins of right lower extremity with both ulcer of other part of lower extremity and inflammation: Secondary | ICD-10-CM | POA: Diagnosis not present

## 2016-03-21 DIAGNOSIS — I130 Hypertensive heart and chronic kidney disease with heart failure and stage 1 through stage 4 chronic kidney disease, or unspecified chronic kidney disease: Secondary | ICD-10-CM | POA: Diagnosis not present

## 2016-03-21 DIAGNOSIS — L97822 Non-pressure chronic ulcer of other part of left lower leg with fat layer exposed: Secondary | ICD-10-CM | POA: Diagnosis not present

## 2016-03-21 DIAGNOSIS — L97812 Non-pressure chronic ulcer of other part of right lower leg with fat layer exposed: Secondary | ICD-10-CM | POA: Diagnosis not present

## 2016-03-21 DIAGNOSIS — Z7901 Long term (current) use of anticoagulants: Secondary | ICD-10-CM | POA: Diagnosis not present

## 2016-03-21 DIAGNOSIS — E1122 Type 2 diabetes mellitus with diabetic chronic kidney disease: Secondary | ICD-10-CM | POA: Diagnosis not present

## 2016-03-21 DIAGNOSIS — Z952 Presence of prosthetic heart valve: Secondary | ICD-10-CM | POA: Diagnosis not present

## 2016-03-21 DIAGNOSIS — N183 Chronic kidney disease, stage 3 (moderate): Secondary | ICD-10-CM | POA: Diagnosis not present

## 2016-03-21 DIAGNOSIS — I2510 Atherosclerotic heart disease of native coronary artery without angina pectoris: Secondary | ICD-10-CM | POA: Diagnosis not present

## 2016-03-21 DIAGNOSIS — Z9181 History of falling: Secondary | ICD-10-CM | POA: Diagnosis not present

## 2016-03-21 DIAGNOSIS — E1143 Type 2 diabetes mellitus with diabetic autonomic (poly)neuropathy: Secondary | ICD-10-CM | POA: Diagnosis not present

## 2016-03-21 DIAGNOSIS — I482 Chronic atrial fibrillation: Secondary | ICD-10-CM | POA: Diagnosis not present

## 2016-03-22 ENCOUNTER — Ambulatory Visit: Payer: Medicare Other | Admitting: Occupational Therapy

## 2016-03-26 DIAGNOSIS — L97822 Non-pressure chronic ulcer of other part of left lower leg with fat layer exposed: Secondary | ICD-10-CM | POA: Diagnosis not present

## 2016-03-26 DIAGNOSIS — Z9181 History of falling: Secondary | ICD-10-CM | POA: Diagnosis not present

## 2016-03-26 DIAGNOSIS — L97812 Non-pressure chronic ulcer of other part of right lower leg with fat layer exposed: Secondary | ICD-10-CM | POA: Diagnosis not present

## 2016-03-26 DIAGNOSIS — E1122 Type 2 diabetes mellitus with diabetic chronic kidney disease: Secondary | ICD-10-CM | POA: Diagnosis not present

## 2016-03-26 DIAGNOSIS — I130 Hypertensive heart and chronic kidney disease with heart failure and stage 1 through stage 4 chronic kidney disease, or unspecified chronic kidney disease: Secondary | ICD-10-CM | POA: Diagnosis not present

## 2016-03-26 DIAGNOSIS — Z7901 Long term (current) use of anticoagulants: Secondary | ICD-10-CM | POA: Diagnosis not present

## 2016-03-26 DIAGNOSIS — E1143 Type 2 diabetes mellitus with diabetic autonomic (poly)neuropathy: Secondary | ICD-10-CM | POA: Diagnosis not present

## 2016-03-26 DIAGNOSIS — N183 Chronic kidney disease, stage 3 (moderate): Secondary | ICD-10-CM | POA: Diagnosis not present

## 2016-03-26 DIAGNOSIS — I5022 Chronic systolic (congestive) heart failure: Secondary | ICD-10-CM | POA: Diagnosis not present

## 2016-03-26 DIAGNOSIS — I482 Chronic atrial fibrillation: Secondary | ICD-10-CM | POA: Diagnosis not present

## 2016-03-26 DIAGNOSIS — I83218 Varicose veins of right lower extremity with both ulcer of other part of lower extremity and inflammation: Secondary | ICD-10-CM | POA: Diagnosis not present

## 2016-03-26 DIAGNOSIS — I83228 Varicose veins of left lower extremity with both ulcer of other part of lower extremity and inflammation: Secondary | ICD-10-CM | POA: Diagnosis not present

## 2016-03-26 DIAGNOSIS — I2510 Atherosclerotic heart disease of native coronary artery without angina pectoris: Secondary | ICD-10-CM | POA: Diagnosis not present

## 2016-03-26 DIAGNOSIS — Z952 Presence of prosthetic heart valve: Secondary | ICD-10-CM | POA: Diagnosis not present

## 2016-03-28 DIAGNOSIS — Z9181 History of falling: Secondary | ICD-10-CM | POA: Diagnosis not present

## 2016-03-28 DIAGNOSIS — I83228 Varicose veins of left lower extremity with both ulcer of other part of lower extremity and inflammation: Secondary | ICD-10-CM | POA: Diagnosis not present

## 2016-03-28 DIAGNOSIS — H2703 Aphakia, bilateral: Secondary | ICD-10-CM | POA: Diagnosis not present

## 2016-03-28 DIAGNOSIS — Z952 Presence of prosthetic heart valve: Secondary | ICD-10-CM | POA: Diagnosis not present

## 2016-03-28 DIAGNOSIS — E1143 Type 2 diabetes mellitus with diabetic autonomic (poly)neuropathy: Secondary | ICD-10-CM | POA: Diagnosis not present

## 2016-03-28 DIAGNOSIS — N183 Chronic kidney disease, stage 3 (moderate): Secondary | ICD-10-CM | POA: Diagnosis not present

## 2016-03-28 DIAGNOSIS — E1122 Type 2 diabetes mellitus with diabetic chronic kidney disease: Secondary | ICD-10-CM | POA: Diagnosis not present

## 2016-03-28 DIAGNOSIS — L97812 Non-pressure chronic ulcer of other part of right lower leg with fat layer exposed: Secondary | ICD-10-CM | POA: Diagnosis not present

## 2016-03-28 DIAGNOSIS — I5022 Chronic systolic (congestive) heart failure: Secondary | ICD-10-CM | POA: Diagnosis not present

## 2016-03-28 DIAGNOSIS — L97822 Non-pressure chronic ulcer of other part of left lower leg with fat layer exposed: Secondary | ICD-10-CM | POA: Diagnosis not present

## 2016-03-28 DIAGNOSIS — Z7901 Long term (current) use of anticoagulants: Secondary | ICD-10-CM | POA: Diagnosis not present

## 2016-03-28 DIAGNOSIS — I130 Hypertensive heart and chronic kidney disease with heart failure and stage 1 through stage 4 chronic kidney disease, or unspecified chronic kidney disease: Secondary | ICD-10-CM | POA: Diagnosis not present

## 2016-03-28 DIAGNOSIS — I83218 Varicose veins of right lower extremity with both ulcer of other part of lower extremity and inflammation: Secondary | ICD-10-CM | POA: Diagnosis not present

## 2016-03-28 DIAGNOSIS — I482 Chronic atrial fibrillation: Secondary | ICD-10-CM | POA: Diagnosis not present

## 2016-03-28 DIAGNOSIS — I2510 Atherosclerotic heart disease of native coronary artery without angina pectoris: Secondary | ICD-10-CM | POA: Diagnosis not present

## 2016-04-02 DIAGNOSIS — I83218 Varicose veins of right lower extremity with both ulcer of other part of lower extremity and inflammation: Secondary | ICD-10-CM | POA: Diagnosis not present

## 2016-04-02 DIAGNOSIS — L97812 Non-pressure chronic ulcer of other part of right lower leg with fat layer exposed: Secondary | ICD-10-CM | POA: Diagnosis not present

## 2016-04-02 DIAGNOSIS — I130 Hypertensive heart and chronic kidney disease with heart failure and stage 1 through stage 4 chronic kidney disease, or unspecified chronic kidney disease: Secondary | ICD-10-CM | POA: Diagnosis not present

## 2016-04-02 DIAGNOSIS — E1143 Type 2 diabetes mellitus with diabetic autonomic (poly)neuropathy: Secondary | ICD-10-CM | POA: Diagnosis not present

## 2016-04-02 DIAGNOSIS — L97822 Non-pressure chronic ulcer of other part of left lower leg with fat layer exposed: Secondary | ICD-10-CM | POA: Diagnosis not present

## 2016-04-02 DIAGNOSIS — N183 Chronic kidney disease, stage 3 (moderate): Secondary | ICD-10-CM | POA: Diagnosis not present

## 2016-04-02 DIAGNOSIS — Z9181 History of falling: Secondary | ICD-10-CM | POA: Diagnosis not present

## 2016-04-02 DIAGNOSIS — E1122 Type 2 diabetes mellitus with diabetic chronic kidney disease: Secondary | ICD-10-CM | POA: Diagnosis not present

## 2016-04-02 DIAGNOSIS — I2510 Atherosclerotic heart disease of native coronary artery without angina pectoris: Secondary | ICD-10-CM | POA: Diagnosis not present

## 2016-04-02 DIAGNOSIS — I482 Chronic atrial fibrillation: Secondary | ICD-10-CM | POA: Diagnosis not present

## 2016-04-02 DIAGNOSIS — Z952 Presence of prosthetic heart valve: Secondary | ICD-10-CM | POA: Diagnosis not present

## 2016-04-02 DIAGNOSIS — Z7901 Long term (current) use of anticoagulants: Secondary | ICD-10-CM | POA: Diagnosis not present

## 2016-04-02 DIAGNOSIS — I5022 Chronic systolic (congestive) heart failure: Secondary | ICD-10-CM | POA: Diagnosis not present

## 2016-04-02 DIAGNOSIS — I83228 Varicose veins of left lower extremity with both ulcer of other part of lower extremity and inflammation: Secondary | ICD-10-CM | POA: Diagnosis not present

## 2016-04-03 DIAGNOSIS — L97822 Non-pressure chronic ulcer of other part of left lower leg with fat layer exposed: Secondary | ICD-10-CM | POA: Diagnosis not present

## 2016-04-03 DIAGNOSIS — E1143 Type 2 diabetes mellitus with diabetic autonomic (poly)neuropathy: Secondary | ICD-10-CM | POA: Diagnosis not present

## 2016-04-03 DIAGNOSIS — Z9181 History of falling: Secondary | ICD-10-CM | POA: Diagnosis not present

## 2016-04-03 DIAGNOSIS — I130 Hypertensive heart and chronic kidney disease with heart failure and stage 1 through stage 4 chronic kidney disease, or unspecified chronic kidney disease: Secondary | ICD-10-CM | POA: Diagnosis not present

## 2016-04-03 DIAGNOSIS — I5022 Chronic systolic (congestive) heart failure: Secondary | ICD-10-CM | POA: Diagnosis not present

## 2016-04-03 DIAGNOSIS — Z952 Presence of prosthetic heart valve: Secondary | ICD-10-CM | POA: Diagnosis not present

## 2016-04-03 DIAGNOSIS — I83228 Varicose veins of left lower extremity with both ulcer of other part of lower extremity and inflammation: Secondary | ICD-10-CM | POA: Diagnosis not present

## 2016-04-03 DIAGNOSIS — I2510 Atherosclerotic heart disease of native coronary artery without angina pectoris: Secondary | ICD-10-CM | POA: Diagnosis not present

## 2016-04-03 DIAGNOSIS — Z7901 Long term (current) use of anticoagulants: Secondary | ICD-10-CM | POA: Diagnosis not present

## 2016-04-03 DIAGNOSIS — L97812 Non-pressure chronic ulcer of other part of right lower leg with fat layer exposed: Secondary | ICD-10-CM | POA: Diagnosis not present

## 2016-04-03 DIAGNOSIS — I482 Chronic atrial fibrillation: Secondary | ICD-10-CM | POA: Diagnosis not present

## 2016-04-03 DIAGNOSIS — I83218 Varicose veins of right lower extremity with both ulcer of other part of lower extremity and inflammation: Secondary | ICD-10-CM | POA: Diagnosis not present

## 2016-04-03 DIAGNOSIS — E1122 Type 2 diabetes mellitus with diabetic chronic kidney disease: Secondary | ICD-10-CM | POA: Diagnosis not present

## 2016-04-03 DIAGNOSIS — N183 Chronic kidney disease, stage 3 (moderate): Secondary | ICD-10-CM | POA: Diagnosis not present

## 2016-04-05 DIAGNOSIS — L97822 Non-pressure chronic ulcer of other part of left lower leg with fat layer exposed: Secondary | ICD-10-CM | POA: Diagnosis not present

## 2016-04-05 DIAGNOSIS — I83218 Varicose veins of right lower extremity with both ulcer of other part of lower extremity and inflammation: Secondary | ICD-10-CM | POA: Diagnosis not present

## 2016-04-05 DIAGNOSIS — Z7901 Long term (current) use of anticoagulants: Secondary | ICD-10-CM | POA: Diagnosis not present

## 2016-04-05 DIAGNOSIS — I5022 Chronic systolic (congestive) heart failure: Secondary | ICD-10-CM | POA: Diagnosis not present

## 2016-04-05 DIAGNOSIS — Z952 Presence of prosthetic heart valve: Secondary | ICD-10-CM | POA: Diagnosis not present

## 2016-04-05 DIAGNOSIS — I130 Hypertensive heart and chronic kidney disease with heart failure and stage 1 through stage 4 chronic kidney disease, or unspecified chronic kidney disease: Secondary | ICD-10-CM | POA: Diagnosis not present

## 2016-04-05 DIAGNOSIS — Z9181 History of falling: Secondary | ICD-10-CM | POA: Diagnosis not present

## 2016-04-05 DIAGNOSIS — E1143 Type 2 diabetes mellitus with diabetic autonomic (poly)neuropathy: Secondary | ICD-10-CM | POA: Diagnosis not present

## 2016-04-05 DIAGNOSIS — N183 Chronic kidney disease, stage 3 (moderate): Secondary | ICD-10-CM | POA: Diagnosis not present

## 2016-04-05 DIAGNOSIS — E1122 Type 2 diabetes mellitus with diabetic chronic kidney disease: Secondary | ICD-10-CM | POA: Diagnosis not present

## 2016-04-05 DIAGNOSIS — I83228 Varicose veins of left lower extremity with both ulcer of other part of lower extremity and inflammation: Secondary | ICD-10-CM | POA: Diagnosis not present

## 2016-04-05 DIAGNOSIS — L97812 Non-pressure chronic ulcer of other part of right lower leg with fat layer exposed: Secondary | ICD-10-CM | POA: Diagnosis not present

## 2016-04-05 DIAGNOSIS — I482 Chronic atrial fibrillation: Secondary | ICD-10-CM | POA: Diagnosis not present

## 2016-04-05 DIAGNOSIS — I2510 Atherosclerotic heart disease of native coronary artery without angina pectoris: Secondary | ICD-10-CM | POA: Diagnosis not present

## 2016-04-09 DIAGNOSIS — I83218 Varicose veins of right lower extremity with both ulcer of other part of lower extremity and inflammation: Secondary | ICD-10-CM | POA: Diagnosis not present

## 2016-04-09 DIAGNOSIS — L97812 Non-pressure chronic ulcer of other part of right lower leg with fat layer exposed: Secondary | ICD-10-CM | POA: Diagnosis not present

## 2016-04-09 DIAGNOSIS — Z7901 Long term (current) use of anticoagulants: Secondary | ICD-10-CM | POA: Diagnosis not present

## 2016-04-09 DIAGNOSIS — I2510 Atherosclerotic heart disease of native coronary artery without angina pectoris: Secondary | ICD-10-CM | POA: Diagnosis not present

## 2016-04-09 DIAGNOSIS — Z9181 History of falling: Secondary | ICD-10-CM | POA: Diagnosis not present

## 2016-04-09 DIAGNOSIS — E1143 Type 2 diabetes mellitus with diabetic autonomic (poly)neuropathy: Secondary | ICD-10-CM | POA: Diagnosis not present

## 2016-04-09 DIAGNOSIS — I83228 Varicose veins of left lower extremity with both ulcer of other part of lower extremity and inflammation: Secondary | ICD-10-CM | POA: Diagnosis not present

## 2016-04-09 DIAGNOSIS — E1122 Type 2 diabetes mellitus with diabetic chronic kidney disease: Secondary | ICD-10-CM | POA: Diagnosis not present

## 2016-04-09 DIAGNOSIS — L97822 Non-pressure chronic ulcer of other part of left lower leg with fat layer exposed: Secondary | ICD-10-CM | POA: Diagnosis not present

## 2016-04-09 DIAGNOSIS — N183 Chronic kidney disease, stage 3 (moderate): Secondary | ICD-10-CM | POA: Diagnosis not present

## 2016-04-09 DIAGNOSIS — I482 Chronic atrial fibrillation: Secondary | ICD-10-CM | POA: Diagnosis not present

## 2016-04-09 DIAGNOSIS — I130 Hypertensive heart and chronic kidney disease with heart failure and stage 1 through stage 4 chronic kidney disease, or unspecified chronic kidney disease: Secondary | ICD-10-CM | POA: Diagnosis not present

## 2016-04-09 DIAGNOSIS — I5022 Chronic systolic (congestive) heart failure: Secondary | ICD-10-CM | POA: Diagnosis not present

## 2016-04-09 DIAGNOSIS — Z952 Presence of prosthetic heart valve: Secondary | ICD-10-CM | POA: Diagnosis not present

## 2016-04-10 DIAGNOSIS — E1143 Type 2 diabetes mellitus with diabetic autonomic (poly)neuropathy: Secondary | ICD-10-CM | POA: Diagnosis not present

## 2016-04-10 DIAGNOSIS — I83218 Varicose veins of right lower extremity with both ulcer of other part of lower extremity and inflammation: Secondary | ICD-10-CM | POA: Diagnosis not present

## 2016-04-10 DIAGNOSIS — I2510 Atherosclerotic heart disease of native coronary artery without angina pectoris: Secondary | ICD-10-CM | POA: Diagnosis not present

## 2016-04-10 DIAGNOSIS — Z7901 Long term (current) use of anticoagulants: Secondary | ICD-10-CM | POA: Diagnosis not present

## 2016-04-10 DIAGNOSIS — I83228 Varicose veins of left lower extremity with both ulcer of other part of lower extremity and inflammation: Secondary | ICD-10-CM | POA: Diagnosis not present

## 2016-04-10 DIAGNOSIS — N183 Chronic kidney disease, stage 3 (moderate): Secondary | ICD-10-CM | POA: Diagnosis not present

## 2016-04-10 DIAGNOSIS — L97822 Non-pressure chronic ulcer of other part of left lower leg with fat layer exposed: Secondary | ICD-10-CM | POA: Diagnosis not present

## 2016-04-10 DIAGNOSIS — E1122 Type 2 diabetes mellitus with diabetic chronic kidney disease: Secondary | ICD-10-CM | POA: Diagnosis not present

## 2016-04-10 DIAGNOSIS — I130 Hypertensive heart and chronic kidney disease with heart failure and stage 1 through stage 4 chronic kidney disease, or unspecified chronic kidney disease: Secondary | ICD-10-CM | POA: Diagnosis not present

## 2016-04-10 DIAGNOSIS — L97812 Non-pressure chronic ulcer of other part of right lower leg with fat layer exposed: Secondary | ICD-10-CM | POA: Diagnosis not present

## 2016-04-10 DIAGNOSIS — I5022 Chronic systolic (congestive) heart failure: Secondary | ICD-10-CM | POA: Diagnosis not present

## 2016-04-10 DIAGNOSIS — Z9181 History of falling: Secondary | ICD-10-CM | POA: Diagnosis not present

## 2016-04-10 DIAGNOSIS — Z952 Presence of prosthetic heart valve: Secondary | ICD-10-CM | POA: Diagnosis not present

## 2016-04-10 DIAGNOSIS — I482 Chronic atrial fibrillation: Secondary | ICD-10-CM | POA: Diagnosis not present

## 2016-04-12 DIAGNOSIS — L97822 Non-pressure chronic ulcer of other part of left lower leg with fat layer exposed: Secondary | ICD-10-CM | POA: Diagnosis not present

## 2016-04-12 DIAGNOSIS — I83218 Varicose veins of right lower extremity with both ulcer of other part of lower extremity and inflammation: Secondary | ICD-10-CM | POA: Diagnosis not present

## 2016-04-12 DIAGNOSIS — N183 Chronic kidney disease, stage 3 (moderate): Secondary | ICD-10-CM | POA: Diagnosis not present

## 2016-04-12 DIAGNOSIS — I2510 Atherosclerotic heart disease of native coronary artery without angina pectoris: Secondary | ICD-10-CM | POA: Diagnosis not present

## 2016-04-12 DIAGNOSIS — I482 Chronic atrial fibrillation: Secondary | ICD-10-CM | POA: Diagnosis not present

## 2016-04-12 DIAGNOSIS — Z9181 History of falling: Secondary | ICD-10-CM | POA: Diagnosis not present

## 2016-04-12 DIAGNOSIS — Z7901 Long term (current) use of anticoagulants: Secondary | ICD-10-CM | POA: Diagnosis not present

## 2016-04-12 DIAGNOSIS — E1143 Type 2 diabetes mellitus with diabetic autonomic (poly)neuropathy: Secondary | ICD-10-CM | POA: Diagnosis not present

## 2016-04-12 DIAGNOSIS — I5022 Chronic systolic (congestive) heart failure: Secondary | ICD-10-CM | POA: Diagnosis not present

## 2016-04-12 DIAGNOSIS — L97812 Non-pressure chronic ulcer of other part of right lower leg with fat layer exposed: Secondary | ICD-10-CM | POA: Diagnosis not present

## 2016-04-12 DIAGNOSIS — I83228 Varicose veins of left lower extremity with both ulcer of other part of lower extremity and inflammation: Secondary | ICD-10-CM | POA: Diagnosis not present

## 2016-04-12 DIAGNOSIS — Z952 Presence of prosthetic heart valve: Secondary | ICD-10-CM | POA: Diagnosis not present

## 2016-04-12 DIAGNOSIS — E1122 Type 2 diabetes mellitus with diabetic chronic kidney disease: Secondary | ICD-10-CM | POA: Diagnosis not present

## 2016-04-12 DIAGNOSIS — I130 Hypertensive heart and chronic kidney disease with heart failure and stage 1 through stage 4 chronic kidney disease, or unspecified chronic kidney disease: Secondary | ICD-10-CM | POA: Diagnosis not present

## 2016-04-16 DIAGNOSIS — L97822 Non-pressure chronic ulcer of other part of left lower leg with fat layer exposed: Secondary | ICD-10-CM | POA: Diagnosis not present

## 2016-04-16 DIAGNOSIS — L97812 Non-pressure chronic ulcer of other part of right lower leg with fat layer exposed: Secondary | ICD-10-CM | POA: Diagnosis not present

## 2016-04-16 DIAGNOSIS — E1143 Type 2 diabetes mellitus with diabetic autonomic (poly)neuropathy: Secondary | ICD-10-CM | POA: Diagnosis not present

## 2016-04-16 DIAGNOSIS — Z9181 History of falling: Secondary | ICD-10-CM | POA: Diagnosis not present

## 2016-04-16 DIAGNOSIS — I130 Hypertensive heart and chronic kidney disease with heart failure and stage 1 through stage 4 chronic kidney disease, or unspecified chronic kidney disease: Secondary | ICD-10-CM | POA: Diagnosis not present

## 2016-04-16 DIAGNOSIS — N183 Chronic kidney disease, stage 3 (moderate): Secondary | ICD-10-CM | POA: Diagnosis not present

## 2016-04-16 DIAGNOSIS — Z7901 Long term (current) use of anticoagulants: Secondary | ICD-10-CM | POA: Diagnosis not present

## 2016-04-16 DIAGNOSIS — Z952 Presence of prosthetic heart valve: Secondary | ICD-10-CM | POA: Diagnosis not present

## 2016-04-16 DIAGNOSIS — I83218 Varicose veins of right lower extremity with both ulcer of other part of lower extremity and inflammation: Secondary | ICD-10-CM | POA: Diagnosis not present

## 2016-04-16 DIAGNOSIS — I2510 Atherosclerotic heart disease of native coronary artery without angina pectoris: Secondary | ICD-10-CM | POA: Diagnosis not present

## 2016-04-16 DIAGNOSIS — I5022 Chronic systolic (congestive) heart failure: Secondary | ICD-10-CM | POA: Diagnosis not present

## 2016-04-16 DIAGNOSIS — E1122 Type 2 diabetes mellitus with diabetic chronic kidney disease: Secondary | ICD-10-CM | POA: Diagnosis not present

## 2016-04-16 DIAGNOSIS — I482 Chronic atrial fibrillation: Secondary | ICD-10-CM | POA: Diagnosis not present

## 2016-04-16 DIAGNOSIS — I83228 Varicose veins of left lower extremity with both ulcer of other part of lower extremity and inflammation: Secondary | ICD-10-CM | POA: Diagnosis not present

## 2016-04-17 DIAGNOSIS — I2510 Atherosclerotic heart disease of native coronary artery without angina pectoris: Secondary | ICD-10-CM | POA: Diagnosis not present

## 2016-04-17 DIAGNOSIS — I83228 Varicose veins of left lower extremity with both ulcer of other part of lower extremity and inflammation: Secondary | ICD-10-CM | POA: Diagnosis not present

## 2016-04-17 DIAGNOSIS — I5022 Chronic systolic (congestive) heart failure: Secondary | ICD-10-CM | POA: Diagnosis not present

## 2016-04-17 DIAGNOSIS — I130 Hypertensive heart and chronic kidney disease with heart failure and stage 1 through stage 4 chronic kidney disease, or unspecified chronic kidney disease: Secondary | ICD-10-CM | POA: Diagnosis not present

## 2016-04-17 DIAGNOSIS — I482 Chronic atrial fibrillation: Secondary | ICD-10-CM | POA: Diagnosis not present

## 2016-04-17 DIAGNOSIS — Z7901 Long term (current) use of anticoagulants: Secondary | ICD-10-CM | POA: Diagnosis not present

## 2016-04-17 DIAGNOSIS — Z952 Presence of prosthetic heart valve: Secondary | ICD-10-CM | POA: Diagnosis not present

## 2016-04-17 DIAGNOSIS — N183 Chronic kidney disease, stage 3 (moderate): Secondary | ICD-10-CM | POA: Diagnosis not present

## 2016-04-17 DIAGNOSIS — E1122 Type 2 diabetes mellitus with diabetic chronic kidney disease: Secondary | ICD-10-CM | POA: Diagnosis not present

## 2016-04-17 DIAGNOSIS — L97812 Non-pressure chronic ulcer of other part of right lower leg with fat layer exposed: Secondary | ICD-10-CM | POA: Diagnosis not present

## 2016-04-17 DIAGNOSIS — I83218 Varicose veins of right lower extremity with both ulcer of other part of lower extremity and inflammation: Secondary | ICD-10-CM | POA: Diagnosis not present

## 2016-04-17 DIAGNOSIS — Z9181 History of falling: Secondary | ICD-10-CM | POA: Diagnosis not present

## 2016-04-17 DIAGNOSIS — E1143 Type 2 diabetes mellitus with diabetic autonomic (poly)neuropathy: Secondary | ICD-10-CM | POA: Diagnosis not present

## 2016-04-17 DIAGNOSIS — L97822 Non-pressure chronic ulcer of other part of left lower leg with fat layer exposed: Secondary | ICD-10-CM | POA: Diagnosis not present

## 2016-04-18 DIAGNOSIS — Z7901 Long term (current) use of anticoagulants: Secondary | ICD-10-CM | POA: Diagnosis not present

## 2016-04-18 DIAGNOSIS — I480 Paroxysmal atrial fibrillation: Secondary | ICD-10-CM | POA: Diagnosis not present

## 2016-04-18 DIAGNOSIS — I110 Hypertensive heart disease with heart failure: Secondary | ICD-10-CM | POA: Diagnosis not present

## 2016-04-18 DIAGNOSIS — I272 Other secondary pulmonary hypertension: Secondary | ICD-10-CM | POA: Diagnosis not present

## 2016-04-18 DIAGNOSIS — I5032 Chronic diastolic (congestive) heart failure: Secondary | ICD-10-CM | POA: Diagnosis not present

## 2016-04-19 DIAGNOSIS — L97822 Non-pressure chronic ulcer of other part of left lower leg with fat layer exposed: Secondary | ICD-10-CM | POA: Diagnosis not present

## 2016-04-19 DIAGNOSIS — Z7901 Long term (current) use of anticoagulants: Secondary | ICD-10-CM | POA: Diagnosis not present

## 2016-04-19 DIAGNOSIS — N183 Chronic kidney disease, stage 3 (moderate): Secondary | ICD-10-CM | POA: Diagnosis not present

## 2016-04-19 DIAGNOSIS — Z952 Presence of prosthetic heart valve: Secondary | ICD-10-CM | POA: Diagnosis not present

## 2016-04-19 DIAGNOSIS — Z9181 History of falling: Secondary | ICD-10-CM | POA: Diagnosis not present

## 2016-04-19 DIAGNOSIS — I2510 Atherosclerotic heart disease of native coronary artery without angina pectoris: Secondary | ICD-10-CM | POA: Diagnosis not present

## 2016-04-19 DIAGNOSIS — L97812 Non-pressure chronic ulcer of other part of right lower leg with fat layer exposed: Secondary | ICD-10-CM | POA: Diagnosis not present

## 2016-04-19 DIAGNOSIS — I130 Hypertensive heart and chronic kidney disease with heart failure and stage 1 through stage 4 chronic kidney disease, or unspecified chronic kidney disease: Secondary | ICD-10-CM | POA: Diagnosis not present

## 2016-04-19 DIAGNOSIS — E1143 Type 2 diabetes mellitus with diabetic autonomic (poly)neuropathy: Secondary | ICD-10-CM | POA: Diagnosis not present

## 2016-04-19 DIAGNOSIS — I83218 Varicose veins of right lower extremity with both ulcer of other part of lower extremity and inflammation: Secondary | ICD-10-CM | POA: Diagnosis not present

## 2016-04-19 DIAGNOSIS — I83228 Varicose veins of left lower extremity with both ulcer of other part of lower extremity and inflammation: Secondary | ICD-10-CM | POA: Diagnosis not present

## 2016-04-19 DIAGNOSIS — I482 Chronic atrial fibrillation: Secondary | ICD-10-CM | POA: Diagnosis not present

## 2016-04-19 DIAGNOSIS — I5022 Chronic systolic (congestive) heart failure: Secondary | ICD-10-CM | POA: Diagnosis not present

## 2016-04-19 DIAGNOSIS — E1122 Type 2 diabetes mellitus with diabetic chronic kidney disease: Secondary | ICD-10-CM | POA: Diagnosis not present

## 2016-04-21 DIAGNOSIS — S79911A Unspecified injury of right hip, initial encounter: Secondary | ICD-10-CM | POA: Diagnosis not present

## 2016-04-21 DIAGNOSIS — M7989 Other specified soft tissue disorders: Secondary | ICD-10-CM | POA: Diagnosis not present

## 2016-04-21 DIAGNOSIS — R51 Headache: Secondary | ICD-10-CM | POA: Diagnosis not present

## 2016-04-21 DIAGNOSIS — S4992XA Unspecified injury of left shoulder and upper arm, initial encounter: Secondary | ICD-10-CM | POA: Diagnosis not present

## 2016-04-21 DIAGNOSIS — T148 Other injury of unspecified body region: Secondary | ICD-10-CM | POA: Diagnosis not present

## 2016-04-21 DIAGNOSIS — M25551 Pain in right hip: Secondary | ICD-10-CM | POA: Diagnosis not present

## 2016-04-21 DIAGNOSIS — M25512 Pain in left shoulder: Secondary | ICD-10-CM | POA: Diagnosis not present

## 2016-04-21 DIAGNOSIS — Z96641 Presence of right artificial hip joint: Secondary | ICD-10-CM | POA: Diagnosis not present

## 2016-04-24 DIAGNOSIS — E1143 Type 2 diabetes mellitus with diabetic autonomic (poly)neuropathy: Secondary | ICD-10-CM | POA: Diagnosis not present

## 2016-04-24 DIAGNOSIS — N183 Chronic kidney disease, stage 3 (moderate): Secondary | ICD-10-CM | POA: Diagnosis not present

## 2016-04-24 DIAGNOSIS — I5022 Chronic systolic (congestive) heart failure: Secondary | ICD-10-CM | POA: Diagnosis not present

## 2016-04-24 DIAGNOSIS — Z952 Presence of prosthetic heart valve: Secondary | ICD-10-CM | POA: Diagnosis not present

## 2016-04-24 DIAGNOSIS — I2510 Atherosclerotic heart disease of native coronary artery without angina pectoris: Secondary | ICD-10-CM | POA: Diagnosis not present

## 2016-04-24 DIAGNOSIS — L97812 Non-pressure chronic ulcer of other part of right lower leg with fat layer exposed: Secondary | ICD-10-CM | POA: Diagnosis not present

## 2016-04-24 DIAGNOSIS — Z9181 History of falling: Secondary | ICD-10-CM | POA: Diagnosis not present

## 2016-04-24 DIAGNOSIS — E1122 Type 2 diabetes mellitus with diabetic chronic kidney disease: Secondary | ICD-10-CM | POA: Diagnosis not present

## 2016-04-24 DIAGNOSIS — I130 Hypertensive heart and chronic kidney disease with heart failure and stage 1 through stage 4 chronic kidney disease, or unspecified chronic kidney disease: Secondary | ICD-10-CM | POA: Diagnosis not present

## 2016-04-24 DIAGNOSIS — Z7901 Long term (current) use of anticoagulants: Secondary | ICD-10-CM | POA: Diagnosis not present

## 2016-04-24 DIAGNOSIS — L97822 Non-pressure chronic ulcer of other part of left lower leg with fat layer exposed: Secondary | ICD-10-CM | POA: Diagnosis not present

## 2016-04-24 DIAGNOSIS — I482 Chronic atrial fibrillation: Secondary | ICD-10-CM | POA: Diagnosis not present

## 2016-04-24 DIAGNOSIS — I83218 Varicose veins of right lower extremity with both ulcer of other part of lower extremity and inflammation: Secondary | ICD-10-CM | POA: Diagnosis not present

## 2016-04-24 DIAGNOSIS — I83228 Varicose veins of left lower extremity with both ulcer of other part of lower extremity and inflammation: Secondary | ICD-10-CM | POA: Diagnosis not present

## 2016-04-25 DIAGNOSIS — S7001XD Contusion of right hip, subsequent encounter: Secondary | ICD-10-CM | POA: Diagnosis not present

## 2016-04-25 DIAGNOSIS — N390 Urinary tract infection, site not specified: Secondary | ICD-10-CM | POA: Diagnosis not present

## 2016-04-25 DIAGNOSIS — R791 Abnormal coagulation profile: Secondary | ICD-10-CM | POA: Diagnosis not present

## 2016-04-25 DIAGNOSIS — R350 Frequency of micturition: Secondary | ICD-10-CM | POA: Diagnosis not present

## 2016-04-26 DIAGNOSIS — Z952 Presence of prosthetic heart valve: Secondary | ICD-10-CM | POA: Diagnosis not present

## 2016-04-26 DIAGNOSIS — E1122 Type 2 diabetes mellitus with diabetic chronic kidney disease: Secondary | ICD-10-CM | POA: Diagnosis not present

## 2016-04-26 DIAGNOSIS — I2510 Atherosclerotic heart disease of native coronary artery without angina pectoris: Secondary | ICD-10-CM | POA: Diagnosis not present

## 2016-04-26 DIAGNOSIS — N183 Chronic kidney disease, stage 3 (moderate): Secondary | ICD-10-CM | POA: Diagnosis not present

## 2016-04-26 DIAGNOSIS — I130 Hypertensive heart and chronic kidney disease with heart failure and stage 1 through stage 4 chronic kidney disease, or unspecified chronic kidney disease: Secondary | ICD-10-CM | POA: Diagnosis not present

## 2016-04-26 DIAGNOSIS — L97822 Non-pressure chronic ulcer of other part of left lower leg with fat layer exposed: Secondary | ICD-10-CM | POA: Diagnosis not present

## 2016-04-26 DIAGNOSIS — E1143 Type 2 diabetes mellitus with diabetic autonomic (poly)neuropathy: Secondary | ICD-10-CM | POA: Diagnosis not present

## 2016-04-26 DIAGNOSIS — L97812 Non-pressure chronic ulcer of other part of right lower leg with fat layer exposed: Secondary | ICD-10-CM | POA: Diagnosis not present

## 2016-04-26 DIAGNOSIS — I5022 Chronic systolic (congestive) heart failure: Secondary | ICD-10-CM | POA: Diagnosis not present

## 2016-04-26 DIAGNOSIS — I83218 Varicose veins of right lower extremity with both ulcer of other part of lower extremity and inflammation: Secondary | ICD-10-CM | POA: Diagnosis not present

## 2016-04-26 DIAGNOSIS — I83228 Varicose veins of left lower extremity with both ulcer of other part of lower extremity and inflammation: Secondary | ICD-10-CM | POA: Diagnosis not present

## 2016-04-26 DIAGNOSIS — Z9181 History of falling: Secondary | ICD-10-CM | POA: Diagnosis not present

## 2016-04-26 DIAGNOSIS — I482 Chronic atrial fibrillation: Secondary | ICD-10-CM | POA: Diagnosis not present

## 2016-04-26 DIAGNOSIS — Z7901 Long term (current) use of anticoagulants: Secondary | ICD-10-CM | POA: Diagnosis not present

## 2016-04-27 DIAGNOSIS — R791 Abnormal coagulation profile: Secondary | ICD-10-CM | POA: Diagnosis not present

## 2016-05-01 DIAGNOSIS — E1129 Type 2 diabetes mellitus with other diabetic kidney complication: Secondary | ICD-10-CM | POA: Diagnosis not present

## 2016-05-01 DIAGNOSIS — I4891 Unspecified atrial fibrillation: Secondary | ICD-10-CM | POA: Diagnosis not present

## 2016-05-01 DIAGNOSIS — I10 Essential (primary) hypertension: Secondary | ICD-10-CM | POA: Diagnosis not present

## 2016-05-01 DIAGNOSIS — M109 Gout, unspecified: Secondary | ICD-10-CM | POA: Diagnosis not present

## 2016-05-01 DIAGNOSIS — D509 Iron deficiency anemia, unspecified: Secondary | ICD-10-CM | POA: Diagnosis not present

## 2016-05-01 DIAGNOSIS — E782 Mixed hyperlipidemia: Secondary | ICD-10-CM | POA: Diagnosis not present

## 2016-05-02 DIAGNOSIS — I2510 Atherosclerotic heart disease of native coronary artery without angina pectoris: Secondary | ICD-10-CM | POA: Diagnosis not present

## 2016-05-02 DIAGNOSIS — I83218 Varicose veins of right lower extremity with both ulcer of other part of lower extremity and inflammation: Secondary | ICD-10-CM | POA: Diagnosis not present

## 2016-05-02 DIAGNOSIS — Z9181 History of falling: Secondary | ICD-10-CM | POA: Diagnosis not present

## 2016-05-02 DIAGNOSIS — E1122 Type 2 diabetes mellitus with diabetic chronic kidney disease: Secondary | ICD-10-CM | POA: Diagnosis not present

## 2016-05-02 DIAGNOSIS — Z952 Presence of prosthetic heart valve: Secondary | ICD-10-CM | POA: Diagnosis not present

## 2016-05-02 DIAGNOSIS — L97812 Non-pressure chronic ulcer of other part of right lower leg with fat layer exposed: Secondary | ICD-10-CM | POA: Diagnosis not present

## 2016-05-02 DIAGNOSIS — I83228 Varicose veins of left lower extremity with both ulcer of other part of lower extremity and inflammation: Secondary | ICD-10-CM | POA: Diagnosis not present

## 2016-05-02 DIAGNOSIS — Z7901 Long term (current) use of anticoagulants: Secondary | ICD-10-CM | POA: Diagnosis not present

## 2016-05-02 DIAGNOSIS — E1143 Type 2 diabetes mellitus with diabetic autonomic (poly)neuropathy: Secondary | ICD-10-CM | POA: Diagnosis not present

## 2016-05-02 DIAGNOSIS — I5022 Chronic systolic (congestive) heart failure: Secondary | ICD-10-CM | POA: Diagnosis not present

## 2016-05-02 DIAGNOSIS — I130 Hypertensive heart and chronic kidney disease with heart failure and stage 1 through stage 4 chronic kidney disease, or unspecified chronic kidney disease: Secondary | ICD-10-CM | POA: Diagnosis not present

## 2016-05-02 DIAGNOSIS — I482 Chronic atrial fibrillation: Secondary | ICD-10-CM | POA: Diagnosis not present

## 2016-05-02 DIAGNOSIS — L97822 Non-pressure chronic ulcer of other part of left lower leg with fat layer exposed: Secondary | ICD-10-CM | POA: Diagnosis not present

## 2016-05-02 DIAGNOSIS — N183 Chronic kidney disease, stage 3 (moderate): Secondary | ICD-10-CM | POA: Diagnosis not present

## 2016-05-03 DIAGNOSIS — I482 Chronic atrial fibrillation: Secondary | ICD-10-CM | POA: Diagnosis not present

## 2016-05-03 DIAGNOSIS — I83228 Varicose veins of left lower extremity with both ulcer of other part of lower extremity and inflammation: Secondary | ICD-10-CM | POA: Diagnosis not present

## 2016-05-03 DIAGNOSIS — Z9181 History of falling: Secondary | ICD-10-CM | POA: Diagnosis not present

## 2016-05-03 DIAGNOSIS — Z952 Presence of prosthetic heart valve: Secondary | ICD-10-CM | POA: Diagnosis not present

## 2016-05-03 DIAGNOSIS — L97822 Non-pressure chronic ulcer of other part of left lower leg with fat layer exposed: Secondary | ICD-10-CM | POA: Diagnosis not present

## 2016-05-03 DIAGNOSIS — N183 Chronic kidney disease, stage 3 (moderate): Secondary | ICD-10-CM | POA: Diagnosis not present

## 2016-05-03 DIAGNOSIS — E1143 Type 2 diabetes mellitus with diabetic autonomic (poly)neuropathy: Secondary | ICD-10-CM | POA: Diagnosis not present

## 2016-05-03 DIAGNOSIS — I5022 Chronic systolic (congestive) heart failure: Secondary | ICD-10-CM | POA: Diagnosis not present

## 2016-05-03 DIAGNOSIS — I2510 Atherosclerotic heart disease of native coronary artery without angina pectoris: Secondary | ICD-10-CM | POA: Diagnosis not present

## 2016-05-03 DIAGNOSIS — Z7901 Long term (current) use of anticoagulants: Secondary | ICD-10-CM | POA: Diagnosis not present

## 2016-05-03 DIAGNOSIS — L97812 Non-pressure chronic ulcer of other part of right lower leg with fat layer exposed: Secondary | ICD-10-CM | POA: Diagnosis not present

## 2016-05-03 DIAGNOSIS — I130 Hypertensive heart and chronic kidney disease with heart failure and stage 1 through stage 4 chronic kidney disease, or unspecified chronic kidney disease: Secondary | ICD-10-CM | POA: Diagnosis not present

## 2016-05-03 DIAGNOSIS — I83218 Varicose veins of right lower extremity with both ulcer of other part of lower extremity and inflammation: Secondary | ICD-10-CM | POA: Diagnosis not present

## 2016-05-03 DIAGNOSIS — E1122 Type 2 diabetes mellitus with diabetic chronic kidney disease: Secondary | ICD-10-CM | POA: Diagnosis not present

## 2016-05-04 DIAGNOSIS — Z7901 Long term (current) use of anticoagulants: Secondary | ICD-10-CM | POA: Diagnosis not present

## 2016-05-08 DIAGNOSIS — I482 Chronic atrial fibrillation: Secondary | ICD-10-CM | POA: Diagnosis not present

## 2016-05-08 DIAGNOSIS — I83228 Varicose veins of left lower extremity with both ulcer of other part of lower extremity and inflammation: Secondary | ICD-10-CM | POA: Diagnosis not present

## 2016-05-08 DIAGNOSIS — L97812 Non-pressure chronic ulcer of other part of right lower leg with fat layer exposed: Secondary | ICD-10-CM | POA: Diagnosis not present

## 2016-05-08 DIAGNOSIS — I2510 Atherosclerotic heart disease of native coronary artery without angina pectoris: Secondary | ICD-10-CM | POA: Diagnosis not present

## 2016-05-08 DIAGNOSIS — Z7901 Long term (current) use of anticoagulants: Secondary | ICD-10-CM | POA: Diagnosis not present

## 2016-05-08 DIAGNOSIS — E1143 Type 2 diabetes mellitus with diabetic autonomic (poly)neuropathy: Secondary | ICD-10-CM | POA: Diagnosis not present

## 2016-05-08 DIAGNOSIS — I83218 Varicose veins of right lower extremity with both ulcer of other part of lower extremity and inflammation: Secondary | ICD-10-CM | POA: Diagnosis not present

## 2016-05-08 DIAGNOSIS — I5022 Chronic systolic (congestive) heart failure: Secondary | ICD-10-CM | POA: Diagnosis not present

## 2016-05-08 DIAGNOSIS — Z952 Presence of prosthetic heart valve: Secondary | ICD-10-CM | POA: Diagnosis not present

## 2016-05-08 DIAGNOSIS — L97822 Non-pressure chronic ulcer of other part of left lower leg with fat layer exposed: Secondary | ICD-10-CM | POA: Diagnosis not present

## 2016-05-08 DIAGNOSIS — Z9181 History of falling: Secondary | ICD-10-CM | POA: Diagnosis not present

## 2016-05-08 DIAGNOSIS — N183 Chronic kidney disease, stage 3 (moderate): Secondary | ICD-10-CM | POA: Diagnosis not present

## 2016-05-08 DIAGNOSIS — E1122 Type 2 diabetes mellitus with diabetic chronic kidney disease: Secondary | ICD-10-CM | POA: Diagnosis not present

## 2016-05-08 DIAGNOSIS — I130 Hypertensive heart and chronic kidney disease with heart failure and stage 1 through stage 4 chronic kidney disease, or unspecified chronic kidney disease: Secondary | ICD-10-CM | POA: Diagnosis not present

## 2016-05-09 DIAGNOSIS — I83228 Varicose veins of left lower extremity with both ulcer of other part of lower extremity and inflammation: Secondary | ICD-10-CM | POA: Diagnosis not present

## 2016-05-09 DIAGNOSIS — I83218 Varicose veins of right lower extremity with both ulcer of other part of lower extremity and inflammation: Secondary | ICD-10-CM | POA: Diagnosis not present

## 2016-05-09 DIAGNOSIS — E1143 Type 2 diabetes mellitus with diabetic autonomic (poly)neuropathy: Secondary | ICD-10-CM | POA: Diagnosis not present

## 2016-05-09 DIAGNOSIS — Z952 Presence of prosthetic heart valve: Secondary | ICD-10-CM | POA: Diagnosis not present

## 2016-05-09 DIAGNOSIS — I482 Chronic atrial fibrillation: Secondary | ICD-10-CM | POA: Diagnosis not present

## 2016-05-09 DIAGNOSIS — Z7901 Long term (current) use of anticoagulants: Secondary | ICD-10-CM | POA: Diagnosis not present

## 2016-05-09 DIAGNOSIS — I5022 Chronic systolic (congestive) heart failure: Secondary | ICD-10-CM | POA: Diagnosis not present

## 2016-05-09 DIAGNOSIS — L97812 Non-pressure chronic ulcer of other part of right lower leg with fat layer exposed: Secondary | ICD-10-CM | POA: Diagnosis not present

## 2016-05-09 DIAGNOSIS — Z9181 History of falling: Secondary | ICD-10-CM | POA: Diagnosis not present

## 2016-05-09 DIAGNOSIS — L97822 Non-pressure chronic ulcer of other part of left lower leg with fat layer exposed: Secondary | ICD-10-CM | POA: Diagnosis not present

## 2016-05-09 DIAGNOSIS — I130 Hypertensive heart and chronic kidney disease with heart failure and stage 1 through stage 4 chronic kidney disease, or unspecified chronic kidney disease: Secondary | ICD-10-CM | POA: Diagnosis not present

## 2016-05-09 DIAGNOSIS — N183 Chronic kidney disease, stage 3 (moderate): Secondary | ICD-10-CM | POA: Diagnosis not present

## 2016-05-09 DIAGNOSIS — E1122 Type 2 diabetes mellitus with diabetic chronic kidney disease: Secondary | ICD-10-CM | POA: Diagnosis not present

## 2016-05-09 DIAGNOSIS — I2510 Atherosclerotic heart disease of native coronary artery without angina pectoris: Secondary | ICD-10-CM | POA: Diagnosis not present

## 2016-05-13 DIAGNOSIS — S42035A Nondisplaced fracture of lateral end of left clavicle, initial encounter for closed fracture: Secondary | ICD-10-CM | POA: Diagnosis not present

## 2016-05-13 DIAGNOSIS — S42002A Fracture of unspecified part of left clavicle, initial encounter for closed fracture: Secondary | ICD-10-CM | POA: Diagnosis not present

## 2016-05-13 DIAGNOSIS — M12812 Other specific arthropathies, not elsewhere classified, left shoulder: Secondary | ICD-10-CM | POA: Diagnosis not present

## 2016-05-13 DIAGNOSIS — R52 Pain, unspecified: Secondary | ICD-10-CM | POA: Diagnosis not present

## 2016-05-13 DIAGNOSIS — M25512 Pain in left shoulder: Secondary | ICD-10-CM | POA: Diagnosis not present

## 2016-05-14 DIAGNOSIS — M12812 Other specific arthropathies, not elsewhere classified, left shoulder: Secondary | ICD-10-CM | POA: Diagnosis not present

## 2016-05-14 DIAGNOSIS — M25512 Pain in left shoulder: Secondary | ICD-10-CM | POA: Diagnosis not present

## 2016-05-28 DIAGNOSIS — M75102 Unspecified rotator cuff tear or rupture of left shoulder, not specified as traumatic: Secondary | ICD-10-CM | POA: Diagnosis not present

## 2016-05-31 DIAGNOSIS — E1143 Type 2 diabetes mellitus with diabetic autonomic (poly)neuropathy: Secondary | ICD-10-CM | POA: Diagnosis not present

## 2016-05-31 DIAGNOSIS — I509 Heart failure, unspecified: Secondary | ICD-10-CM | POA: Diagnosis not present

## 2016-05-31 DIAGNOSIS — M21171 Varus deformity, not elsewhere classified, right ankle: Secondary | ICD-10-CM | POA: Diagnosis not present

## 2016-06-06 DIAGNOSIS — Z7901 Long term (current) use of anticoagulants: Secondary | ICD-10-CM | POA: Diagnosis not present

## 2016-06-07 DIAGNOSIS — L570 Actinic keratosis: Secondary | ICD-10-CM | POA: Diagnosis not present

## 2016-06-07 DIAGNOSIS — L565 Disseminated superficial actinic porokeratosis (DSAP): Secondary | ICD-10-CM | POA: Diagnosis not present

## 2016-06-07 DIAGNOSIS — L821 Other seborrheic keratosis: Secondary | ICD-10-CM | POA: Diagnosis not present

## 2016-06-08 DIAGNOSIS — L89321 Pressure ulcer of left buttock, stage 1: Secondary | ICD-10-CM | POA: Diagnosis not present

## 2016-06-08 DIAGNOSIS — M25512 Pain in left shoulder: Secondary | ICD-10-CM | POA: Diagnosis not present

## 2016-06-11 DIAGNOSIS — N390 Urinary tract infection, site not specified: Secondary | ICD-10-CM | POA: Diagnosis not present

## 2016-06-11 DIAGNOSIS — L89321 Pressure ulcer of left buttock, stage 1: Secondary | ICD-10-CM | POA: Diagnosis not present

## 2016-06-15 DIAGNOSIS — L89311 Pressure ulcer of right buttock, stage 1: Secondary | ICD-10-CM | POA: Diagnosis not present

## 2016-06-15 DIAGNOSIS — S81801A Unspecified open wound, right lower leg, initial encounter: Secondary | ICD-10-CM | POA: Diagnosis not present

## 2016-06-15 DIAGNOSIS — L89322 Pressure ulcer of left buttock, stage 2: Secondary | ICD-10-CM | POA: Diagnosis not present

## 2016-06-16 DIAGNOSIS — I2510 Atherosclerotic heart disease of native coronary artery without angina pectoris: Secondary | ICD-10-CM | POA: Diagnosis not present

## 2016-06-16 DIAGNOSIS — L89322 Pressure ulcer of left buttock, stage 2: Secondary | ICD-10-CM | POA: Diagnosis not present

## 2016-06-16 DIAGNOSIS — Z7901 Long term (current) use of anticoagulants: Secondary | ICD-10-CM | POA: Diagnosis not present

## 2016-06-16 DIAGNOSIS — E1122 Type 2 diabetes mellitus with diabetic chronic kidney disease: Secondary | ICD-10-CM | POA: Diagnosis not present

## 2016-06-16 DIAGNOSIS — N183 Chronic kidney disease, stage 3 (moderate): Secondary | ICD-10-CM | POA: Diagnosis not present

## 2016-06-16 DIAGNOSIS — I482 Chronic atrial fibrillation: Secondary | ICD-10-CM | POA: Diagnosis not present

## 2016-06-16 DIAGNOSIS — I130 Hypertensive heart and chronic kidney disease with heart failure and stage 1 through stage 4 chronic kidney disease, or unspecified chronic kidney disease: Secondary | ICD-10-CM | POA: Diagnosis not present

## 2016-06-16 DIAGNOSIS — I5022 Chronic systolic (congestive) heart failure: Secondary | ICD-10-CM | POA: Diagnosis not present

## 2016-06-16 DIAGNOSIS — Z9181 History of falling: Secondary | ICD-10-CM | POA: Diagnosis not present

## 2016-06-16 DIAGNOSIS — L89311 Pressure ulcer of right buttock, stage 1: Secondary | ICD-10-CM | POA: Diagnosis not present

## 2016-06-16 DIAGNOSIS — E1143 Type 2 diabetes mellitus with diabetic autonomic (poly)neuropathy: Secondary | ICD-10-CM | POA: Diagnosis not present

## 2016-06-16 DIAGNOSIS — Z952 Presence of prosthetic heart valve: Secondary | ICD-10-CM | POA: Diagnosis not present

## 2016-06-16 DIAGNOSIS — I87303 Chronic venous hypertension (idiopathic) without complications of bilateral lower extremity: Secondary | ICD-10-CM | POA: Diagnosis not present

## 2016-06-16 DIAGNOSIS — S81801D Unspecified open wound, right lower leg, subsequent encounter: Secondary | ICD-10-CM | POA: Diagnosis not present

## 2016-06-18 DIAGNOSIS — I87303 Chronic venous hypertension (idiopathic) without complications of bilateral lower extremity: Secondary | ICD-10-CM | POA: Diagnosis not present

## 2016-06-18 DIAGNOSIS — I5022 Chronic systolic (congestive) heart failure: Secondary | ICD-10-CM | POA: Diagnosis not present

## 2016-06-18 DIAGNOSIS — Z7901 Long term (current) use of anticoagulants: Secondary | ICD-10-CM | POA: Diagnosis not present

## 2016-06-18 DIAGNOSIS — I2510 Atherosclerotic heart disease of native coronary artery without angina pectoris: Secondary | ICD-10-CM | POA: Diagnosis not present

## 2016-06-18 DIAGNOSIS — S81801D Unspecified open wound, right lower leg, subsequent encounter: Secondary | ICD-10-CM | POA: Diagnosis not present

## 2016-06-18 DIAGNOSIS — L89322 Pressure ulcer of left buttock, stage 2: Secondary | ICD-10-CM | POA: Diagnosis not present

## 2016-06-18 DIAGNOSIS — E1143 Type 2 diabetes mellitus with diabetic autonomic (poly)neuropathy: Secondary | ICD-10-CM | POA: Diagnosis not present

## 2016-06-18 DIAGNOSIS — I130 Hypertensive heart and chronic kidney disease with heart failure and stage 1 through stage 4 chronic kidney disease, or unspecified chronic kidney disease: Secondary | ICD-10-CM | POA: Diagnosis not present

## 2016-06-18 DIAGNOSIS — Z952 Presence of prosthetic heart valve: Secondary | ICD-10-CM | POA: Diagnosis not present

## 2016-06-18 DIAGNOSIS — L89311 Pressure ulcer of right buttock, stage 1: Secondary | ICD-10-CM | POA: Diagnosis not present

## 2016-06-18 DIAGNOSIS — Z9181 History of falling: Secondary | ICD-10-CM | POA: Diagnosis not present

## 2016-06-18 DIAGNOSIS — N183 Chronic kidney disease, stage 3 (moderate): Secondary | ICD-10-CM | POA: Diagnosis not present

## 2016-06-18 DIAGNOSIS — E1122 Type 2 diabetes mellitus with diabetic chronic kidney disease: Secondary | ICD-10-CM | POA: Diagnosis not present

## 2016-06-18 DIAGNOSIS — I482 Chronic atrial fibrillation: Secondary | ICD-10-CM | POA: Diagnosis not present

## 2016-06-19 DIAGNOSIS — I482 Chronic atrial fibrillation: Secondary | ICD-10-CM | POA: Diagnosis not present

## 2016-06-19 DIAGNOSIS — L89322 Pressure ulcer of left buttock, stage 2: Secondary | ICD-10-CM | POA: Diagnosis not present

## 2016-06-19 DIAGNOSIS — I2510 Atherosclerotic heart disease of native coronary artery without angina pectoris: Secondary | ICD-10-CM | POA: Diagnosis not present

## 2016-06-19 DIAGNOSIS — Z9181 History of falling: Secondary | ICD-10-CM | POA: Diagnosis not present

## 2016-06-19 DIAGNOSIS — I130 Hypertensive heart and chronic kidney disease with heart failure and stage 1 through stage 4 chronic kidney disease, or unspecified chronic kidney disease: Secondary | ICD-10-CM | POA: Diagnosis not present

## 2016-06-19 DIAGNOSIS — Z952 Presence of prosthetic heart valve: Secondary | ICD-10-CM | POA: Diagnosis not present

## 2016-06-19 DIAGNOSIS — Z7901 Long term (current) use of anticoagulants: Secondary | ICD-10-CM | POA: Diagnosis not present

## 2016-06-19 DIAGNOSIS — S81801D Unspecified open wound, right lower leg, subsequent encounter: Secondary | ICD-10-CM | POA: Diagnosis not present

## 2016-06-19 DIAGNOSIS — E1122 Type 2 diabetes mellitus with diabetic chronic kidney disease: Secondary | ICD-10-CM | POA: Diagnosis not present

## 2016-06-19 DIAGNOSIS — E1143 Type 2 diabetes mellitus with diabetic autonomic (poly)neuropathy: Secondary | ICD-10-CM | POA: Diagnosis not present

## 2016-06-19 DIAGNOSIS — I5022 Chronic systolic (congestive) heart failure: Secondary | ICD-10-CM | POA: Diagnosis not present

## 2016-06-19 DIAGNOSIS — I87303 Chronic venous hypertension (idiopathic) without complications of bilateral lower extremity: Secondary | ICD-10-CM | POA: Diagnosis not present

## 2016-06-19 DIAGNOSIS — L89311 Pressure ulcer of right buttock, stage 1: Secondary | ICD-10-CM | POA: Diagnosis not present

## 2016-06-19 DIAGNOSIS — N183 Chronic kidney disease, stage 3 (moderate): Secondary | ICD-10-CM | POA: Diagnosis not present

## 2016-06-21 DIAGNOSIS — L89311 Pressure ulcer of right buttock, stage 1: Secondary | ICD-10-CM | POA: Diagnosis not present

## 2016-06-21 DIAGNOSIS — E1122 Type 2 diabetes mellitus with diabetic chronic kidney disease: Secondary | ICD-10-CM | POA: Diagnosis not present

## 2016-06-21 DIAGNOSIS — Z952 Presence of prosthetic heart valve: Secondary | ICD-10-CM | POA: Diagnosis not present

## 2016-06-21 DIAGNOSIS — N183 Chronic kidney disease, stage 3 (moderate): Secondary | ICD-10-CM | POA: Diagnosis not present

## 2016-06-21 DIAGNOSIS — I482 Chronic atrial fibrillation: Secondary | ICD-10-CM | POA: Diagnosis not present

## 2016-06-21 DIAGNOSIS — L89322 Pressure ulcer of left buttock, stage 2: Secondary | ICD-10-CM | POA: Diagnosis not present

## 2016-06-21 DIAGNOSIS — I87303 Chronic venous hypertension (idiopathic) without complications of bilateral lower extremity: Secondary | ICD-10-CM | POA: Diagnosis not present

## 2016-06-21 DIAGNOSIS — I5022 Chronic systolic (congestive) heart failure: Secondary | ICD-10-CM | POA: Diagnosis not present

## 2016-06-21 DIAGNOSIS — Z7901 Long term (current) use of anticoagulants: Secondary | ICD-10-CM | POA: Diagnosis not present

## 2016-06-21 DIAGNOSIS — I2510 Atherosclerotic heart disease of native coronary artery without angina pectoris: Secondary | ICD-10-CM | POA: Diagnosis not present

## 2016-06-21 DIAGNOSIS — Z9181 History of falling: Secondary | ICD-10-CM | POA: Diagnosis not present

## 2016-06-21 DIAGNOSIS — E1143 Type 2 diabetes mellitus with diabetic autonomic (poly)neuropathy: Secondary | ICD-10-CM | POA: Diagnosis not present

## 2016-06-21 DIAGNOSIS — S81801D Unspecified open wound, right lower leg, subsequent encounter: Secondary | ICD-10-CM | POA: Diagnosis not present

## 2016-06-21 DIAGNOSIS — I130 Hypertensive heart and chronic kidney disease with heart failure and stage 1 through stage 4 chronic kidney disease, or unspecified chronic kidney disease: Secondary | ICD-10-CM | POA: Diagnosis not present

## 2016-06-25 DIAGNOSIS — I5022 Chronic systolic (congestive) heart failure: Secondary | ICD-10-CM | POA: Diagnosis not present

## 2016-06-25 DIAGNOSIS — I482 Chronic atrial fibrillation: Secondary | ICD-10-CM | POA: Diagnosis not present

## 2016-06-25 DIAGNOSIS — I2510 Atherosclerotic heart disease of native coronary artery without angina pectoris: Secondary | ICD-10-CM | POA: Diagnosis not present

## 2016-06-25 DIAGNOSIS — Z9181 History of falling: Secondary | ICD-10-CM | POA: Diagnosis not present

## 2016-06-25 DIAGNOSIS — E1122 Type 2 diabetes mellitus with diabetic chronic kidney disease: Secondary | ICD-10-CM | POA: Diagnosis not present

## 2016-06-25 DIAGNOSIS — L89311 Pressure ulcer of right buttock, stage 1: Secondary | ICD-10-CM | POA: Diagnosis not present

## 2016-06-25 DIAGNOSIS — I130 Hypertensive heart and chronic kidney disease with heart failure and stage 1 through stage 4 chronic kidney disease, or unspecified chronic kidney disease: Secondary | ICD-10-CM | POA: Diagnosis not present

## 2016-06-25 DIAGNOSIS — I87303 Chronic venous hypertension (idiopathic) without complications of bilateral lower extremity: Secondary | ICD-10-CM | POA: Diagnosis not present

## 2016-06-25 DIAGNOSIS — S81801D Unspecified open wound, right lower leg, subsequent encounter: Secondary | ICD-10-CM | POA: Diagnosis not present

## 2016-06-25 DIAGNOSIS — L89322 Pressure ulcer of left buttock, stage 2: Secondary | ICD-10-CM | POA: Diagnosis not present

## 2016-06-25 DIAGNOSIS — Z7901 Long term (current) use of anticoagulants: Secondary | ICD-10-CM | POA: Diagnosis not present

## 2016-06-25 DIAGNOSIS — E1143 Type 2 diabetes mellitus with diabetic autonomic (poly)neuropathy: Secondary | ICD-10-CM | POA: Diagnosis not present

## 2016-06-25 DIAGNOSIS — Z952 Presence of prosthetic heart valve: Secondary | ICD-10-CM | POA: Diagnosis not present

## 2016-06-25 DIAGNOSIS — N183 Chronic kidney disease, stage 3 (moderate): Secondary | ICD-10-CM | POA: Diagnosis not present

## 2016-06-27 DIAGNOSIS — I130 Hypertensive heart and chronic kidney disease with heart failure and stage 1 through stage 4 chronic kidney disease, or unspecified chronic kidney disease: Secondary | ICD-10-CM | POA: Diagnosis not present

## 2016-06-27 DIAGNOSIS — Z9181 History of falling: Secondary | ICD-10-CM | POA: Diagnosis not present

## 2016-06-27 DIAGNOSIS — L89322 Pressure ulcer of left buttock, stage 2: Secondary | ICD-10-CM | POA: Diagnosis not present

## 2016-06-27 DIAGNOSIS — I5022 Chronic systolic (congestive) heart failure: Secondary | ICD-10-CM | POA: Diagnosis not present

## 2016-06-27 DIAGNOSIS — E1122 Type 2 diabetes mellitus with diabetic chronic kidney disease: Secondary | ICD-10-CM | POA: Diagnosis not present

## 2016-06-27 DIAGNOSIS — Z952 Presence of prosthetic heart valve: Secondary | ICD-10-CM | POA: Diagnosis not present

## 2016-06-27 DIAGNOSIS — L89311 Pressure ulcer of right buttock, stage 1: Secondary | ICD-10-CM | POA: Diagnosis not present

## 2016-06-27 DIAGNOSIS — I482 Chronic atrial fibrillation: Secondary | ICD-10-CM | POA: Diagnosis not present

## 2016-06-27 DIAGNOSIS — E1143 Type 2 diabetes mellitus with diabetic autonomic (poly)neuropathy: Secondary | ICD-10-CM | POA: Diagnosis not present

## 2016-06-27 DIAGNOSIS — S81801D Unspecified open wound, right lower leg, subsequent encounter: Secondary | ICD-10-CM | POA: Diagnosis not present

## 2016-06-27 DIAGNOSIS — I2510 Atherosclerotic heart disease of native coronary artery without angina pectoris: Secondary | ICD-10-CM | POA: Diagnosis not present

## 2016-06-27 DIAGNOSIS — N183 Chronic kidney disease, stage 3 (moderate): Secondary | ICD-10-CM | POA: Diagnosis not present

## 2016-06-27 DIAGNOSIS — Z7901 Long term (current) use of anticoagulants: Secondary | ICD-10-CM | POA: Diagnosis not present

## 2016-06-27 DIAGNOSIS — I87303 Chronic venous hypertension (idiopathic) without complications of bilateral lower extremity: Secondary | ICD-10-CM | POA: Diagnosis not present

## 2016-06-29 DIAGNOSIS — L89321 Pressure ulcer of left buttock, stage 1: Secondary | ICD-10-CM | POA: Diagnosis not present

## 2016-06-29 DIAGNOSIS — E1143 Type 2 diabetes mellitus with diabetic autonomic (poly)neuropathy: Secondary | ICD-10-CM | POA: Diagnosis not present

## 2016-06-29 DIAGNOSIS — I10 Essential (primary) hypertension: Secondary | ICD-10-CM | POA: Diagnosis not present

## 2016-06-29 DIAGNOSIS — D649 Anemia, unspecified: Secondary | ICD-10-CM | POA: Diagnosis not present

## 2016-06-29 DIAGNOSIS — M21171 Varus deformity, not elsewhere classified, right ankle: Secondary | ICD-10-CM | POA: Diagnosis not present

## 2016-06-29 DIAGNOSIS — E785 Hyperlipidemia, unspecified: Secondary | ICD-10-CM | POA: Diagnosis not present

## 2016-06-29 DIAGNOSIS — M2041 Other hammer toe(s) (acquired), right foot: Secondary | ICD-10-CM | POA: Diagnosis not present

## 2016-06-29 DIAGNOSIS — E041 Nontoxic single thyroid nodule: Secondary | ICD-10-CM | POA: Diagnosis not present

## 2016-06-29 DIAGNOSIS — M109 Gout, unspecified: Secondary | ICD-10-CM | POA: Diagnosis not present

## 2016-06-29 DIAGNOSIS — Z8673 Personal history of transient ischemic attack (TIA), and cerebral infarction without residual deficits: Secondary | ICD-10-CM | POA: Diagnosis not present

## 2016-06-29 DIAGNOSIS — M25512 Pain in left shoulder: Secondary | ICD-10-CM | POA: Diagnosis not present

## 2016-06-29 DIAGNOSIS — Z952 Presence of prosthetic heart valve: Secondary | ICD-10-CM | POA: Diagnosis not present

## 2016-06-29 DIAGNOSIS — C801 Malignant (primary) neoplasm, unspecified: Secondary | ICD-10-CM | POA: Diagnosis not present

## 2016-06-29 DIAGNOSIS — M75112 Incomplete rotator cuff tear or rupture of left shoulder, not specified as traumatic: Secondary | ICD-10-CM | POA: Diagnosis not present

## 2016-06-29 DIAGNOSIS — I509 Heart failure, unspecified: Secondary | ICD-10-CM | POA: Diagnosis not present

## 2016-06-29 DIAGNOSIS — Z7901 Long term (current) use of anticoagulants: Secondary | ICD-10-CM | POA: Diagnosis not present

## 2016-06-29 DIAGNOSIS — S300XXA Contusion of lower back and pelvis, initial encounter: Secondary | ICD-10-CM | POA: Diagnosis not present

## 2016-06-29 DIAGNOSIS — G629 Polyneuropathy, unspecified: Secondary | ICD-10-CM | POA: Diagnosis not present

## 2016-06-29 DIAGNOSIS — D509 Iron deficiency anemia, unspecified: Secondary | ICD-10-CM | POA: Diagnosis not present

## 2016-06-29 DIAGNOSIS — E089 Diabetes mellitus due to underlying condition without complications: Secondary | ICD-10-CM | POA: Diagnosis not present

## 2016-06-29 DIAGNOSIS — K219 Gastro-esophageal reflux disease without esophagitis: Secondary | ICD-10-CM | POA: Diagnosis not present

## 2016-06-29 DIAGNOSIS — N183 Chronic kidney disease, stage 3 (moderate): Secondary | ICD-10-CM | POA: Diagnosis not present

## 2016-06-29 DIAGNOSIS — T148 Other injury of unspecified body region: Secondary | ICD-10-CM | POA: Diagnosis not present

## 2016-06-29 DIAGNOSIS — S73006A Unspecified dislocation of unspecified hip, initial encounter: Secondary | ICD-10-CM | POA: Diagnosis not present

## 2016-06-29 DIAGNOSIS — M1990 Unspecified osteoarthritis, unspecified site: Secondary | ICD-10-CM | POA: Diagnosis not present

## 2016-06-29 DIAGNOSIS — K625 Hemorrhage of anus and rectum: Secondary | ICD-10-CM | POA: Diagnosis not present

## 2016-06-29 DIAGNOSIS — J309 Allergic rhinitis, unspecified: Secondary | ICD-10-CM | POA: Diagnosis not present

## 2016-06-29 DIAGNOSIS — M1A20X0 Drug-induced chronic gout, unspecified site, without tophus (tophi): Secondary | ICD-10-CM | POA: Diagnosis not present

## 2016-06-29 DIAGNOSIS — I482 Chronic atrial fibrillation: Secondary | ICD-10-CM | POA: Diagnosis not present

## 2016-07-02 DIAGNOSIS — E1143 Type 2 diabetes mellitus with diabetic autonomic (poly)neuropathy: Secondary | ICD-10-CM | POA: Diagnosis not present

## 2016-07-02 DIAGNOSIS — G629 Polyneuropathy, unspecified: Secondary | ICD-10-CM | POA: Diagnosis not present

## 2016-07-02 DIAGNOSIS — E089 Diabetes mellitus due to underlying condition without complications: Secondary | ICD-10-CM | POA: Diagnosis not present

## 2016-07-02 DIAGNOSIS — I482 Chronic atrial fibrillation: Secondary | ICD-10-CM | POA: Diagnosis not present

## 2016-07-06 DIAGNOSIS — S300XXA Contusion of lower back and pelvis, initial encounter: Secondary | ICD-10-CM | POA: Diagnosis not present

## 2016-07-06 DIAGNOSIS — Z7901 Long term (current) use of anticoagulants: Secondary | ICD-10-CM | POA: Diagnosis not present

## 2016-07-06 DIAGNOSIS — D649 Anemia, unspecified: Secondary | ICD-10-CM | POA: Diagnosis not present

## 2016-07-10 DIAGNOSIS — E1143 Type 2 diabetes mellitus with diabetic autonomic (poly)neuropathy: Secondary | ICD-10-CM | POA: Diagnosis not present

## 2016-07-10 DIAGNOSIS — G629 Polyneuropathy, unspecified: Secondary | ICD-10-CM | POA: Diagnosis not present

## 2016-07-10 DIAGNOSIS — M1A20X0 Drug-induced chronic gout, unspecified site, without tophus (tophi): Secondary | ICD-10-CM | POA: Diagnosis not present

## 2016-07-10 DIAGNOSIS — I482 Chronic atrial fibrillation: Secondary | ICD-10-CM | POA: Diagnosis not present

## 2016-07-26 DIAGNOSIS — E1143 Type 2 diabetes mellitus with diabetic autonomic (poly)neuropathy: Secondary | ICD-10-CM | POA: Diagnosis not present

## 2016-07-26 DIAGNOSIS — Z7984 Long term (current) use of oral hypoglycemic drugs: Secondary | ICD-10-CM | POA: Diagnosis not present

## 2016-07-26 DIAGNOSIS — Z9181 History of falling: Secondary | ICD-10-CM | POA: Diagnosis not present

## 2016-07-26 DIAGNOSIS — I87303 Chronic venous hypertension (idiopathic) without complications of bilateral lower extremity: Secondary | ICD-10-CM | POA: Diagnosis not present

## 2016-07-26 DIAGNOSIS — Z7901 Long term (current) use of anticoagulants: Secondary | ICD-10-CM | POA: Diagnosis not present

## 2016-07-26 DIAGNOSIS — I5022 Chronic systolic (congestive) heart failure: Secondary | ICD-10-CM | POA: Diagnosis not present

## 2016-07-26 DIAGNOSIS — I130 Hypertensive heart and chronic kidney disease with heart failure and stage 1 through stage 4 chronic kidney disease, or unspecified chronic kidney disease: Secondary | ICD-10-CM | POA: Diagnosis not present

## 2016-07-26 DIAGNOSIS — I480 Paroxysmal atrial fibrillation: Secondary | ICD-10-CM | POA: Diagnosis not present

## 2016-07-26 DIAGNOSIS — Z79891 Long term (current) use of opiate analgesic: Secondary | ICD-10-CM | POA: Diagnosis not present

## 2016-07-26 DIAGNOSIS — I2510 Atherosclerotic heart disease of native coronary artery without angina pectoris: Secondary | ICD-10-CM | POA: Diagnosis not present

## 2016-07-26 DIAGNOSIS — I272 Other secondary pulmonary hypertension: Secondary | ICD-10-CM | POA: Diagnosis not present

## 2016-07-26 DIAGNOSIS — Z952 Presence of prosthetic heart valve: Secondary | ICD-10-CM | POA: Diagnosis not present

## 2016-07-26 DIAGNOSIS — E1122 Type 2 diabetes mellitus with diabetic chronic kidney disease: Secondary | ICD-10-CM | POA: Diagnosis not present

## 2016-07-26 DIAGNOSIS — N183 Chronic kidney disease, stage 3 (moderate): Secondary | ICD-10-CM | POA: Diagnosis not present

## 2016-07-27 DIAGNOSIS — I4891 Unspecified atrial fibrillation: Secondary | ICD-10-CM | POA: Diagnosis not present

## 2016-07-27 DIAGNOSIS — E1129 Type 2 diabetes mellitus with other diabetic kidney complication: Secondary | ICD-10-CM | POA: Diagnosis not present

## 2016-07-27 DIAGNOSIS — R791 Abnormal coagulation profile: Secondary | ICD-10-CM | POA: Diagnosis not present

## 2016-07-31 DIAGNOSIS — I2510 Atherosclerotic heart disease of native coronary artery without angina pectoris: Secondary | ICD-10-CM | POA: Diagnosis not present

## 2016-07-31 DIAGNOSIS — I509 Heart failure, unspecified: Secondary | ICD-10-CM | POA: Diagnosis not present

## 2016-07-31 DIAGNOSIS — Z952 Presence of prosthetic heart valve: Secondary | ICD-10-CM | POA: Diagnosis not present

## 2016-07-31 DIAGNOSIS — Z9181 History of falling: Secondary | ICD-10-CM | POA: Diagnosis not present

## 2016-07-31 DIAGNOSIS — Z7901 Long term (current) use of anticoagulants: Secondary | ICD-10-CM | POA: Diagnosis not present

## 2016-07-31 DIAGNOSIS — E1122 Type 2 diabetes mellitus with diabetic chronic kidney disease: Secondary | ICD-10-CM | POA: Diagnosis not present

## 2016-07-31 DIAGNOSIS — I480 Paroxysmal atrial fibrillation: Secondary | ICD-10-CM | POA: Diagnosis not present

## 2016-07-31 DIAGNOSIS — I87303 Chronic venous hypertension (idiopathic) without complications of bilateral lower extremity: Secondary | ICD-10-CM | POA: Diagnosis not present

## 2016-07-31 DIAGNOSIS — N183 Chronic kidney disease, stage 3 (moderate): Secondary | ICD-10-CM | POA: Diagnosis not present

## 2016-07-31 DIAGNOSIS — M21171 Varus deformity, not elsewhere classified, right ankle: Secondary | ICD-10-CM | POA: Diagnosis not present

## 2016-07-31 DIAGNOSIS — Z7984 Long term (current) use of oral hypoglycemic drugs: Secondary | ICD-10-CM | POA: Diagnosis not present

## 2016-07-31 DIAGNOSIS — I5022 Chronic systolic (congestive) heart failure: Secondary | ICD-10-CM | POA: Diagnosis not present

## 2016-07-31 DIAGNOSIS — E1143 Type 2 diabetes mellitus with diabetic autonomic (poly)neuropathy: Secondary | ICD-10-CM | POA: Diagnosis not present

## 2016-07-31 DIAGNOSIS — I272 Other secondary pulmonary hypertension: Secondary | ICD-10-CM | POA: Diagnosis not present

## 2016-07-31 DIAGNOSIS — Z79891 Long term (current) use of opiate analgesic: Secondary | ICD-10-CM | POA: Diagnosis not present

## 2016-07-31 DIAGNOSIS — I130 Hypertensive heart and chronic kidney disease with heart failure and stage 1 through stage 4 chronic kidney disease, or unspecified chronic kidney disease: Secondary | ICD-10-CM | POA: Diagnosis not present

## 2016-08-01 DIAGNOSIS — I480 Paroxysmal atrial fibrillation: Secondary | ICD-10-CM | POA: Diagnosis not present

## 2016-08-01 DIAGNOSIS — Z952 Presence of prosthetic heart valve: Secondary | ICD-10-CM | POA: Diagnosis not present

## 2016-08-01 DIAGNOSIS — Z7984 Long term (current) use of oral hypoglycemic drugs: Secondary | ICD-10-CM | POA: Diagnosis not present

## 2016-08-01 DIAGNOSIS — I130 Hypertensive heart and chronic kidney disease with heart failure and stage 1 through stage 4 chronic kidney disease, or unspecified chronic kidney disease: Secondary | ICD-10-CM | POA: Diagnosis not present

## 2016-08-01 DIAGNOSIS — I87303 Chronic venous hypertension (idiopathic) without complications of bilateral lower extremity: Secondary | ICD-10-CM | POA: Diagnosis not present

## 2016-08-01 DIAGNOSIS — Z7901 Long term (current) use of anticoagulants: Secondary | ICD-10-CM | POA: Diagnosis not present

## 2016-08-01 DIAGNOSIS — I5022 Chronic systolic (congestive) heart failure: Secondary | ICD-10-CM | POA: Diagnosis not present

## 2016-08-01 DIAGNOSIS — E1143 Type 2 diabetes mellitus with diabetic autonomic (poly)neuropathy: Secondary | ICD-10-CM | POA: Diagnosis not present

## 2016-08-01 DIAGNOSIS — Z9181 History of falling: Secondary | ICD-10-CM | POA: Diagnosis not present

## 2016-08-01 DIAGNOSIS — N183 Chronic kidney disease, stage 3 (moderate): Secondary | ICD-10-CM | POA: Diagnosis not present

## 2016-08-01 DIAGNOSIS — I2510 Atherosclerotic heart disease of native coronary artery without angina pectoris: Secondary | ICD-10-CM | POA: Diagnosis not present

## 2016-08-01 DIAGNOSIS — I272 Other secondary pulmonary hypertension: Secondary | ICD-10-CM | POA: Diagnosis not present

## 2016-08-01 DIAGNOSIS — Z79891 Long term (current) use of opiate analgesic: Secondary | ICD-10-CM | POA: Diagnosis not present

## 2016-08-01 DIAGNOSIS — E1122 Type 2 diabetes mellitus with diabetic chronic kidney disease: Secondary | ICD-10-CM | POA: Diagnosis not present

## 2016-08-03 DIAGNOSIS — Z7901 Long term (current) use of anticoagulants: Secondary | ICD-10-CM | POA: Diagnosis not present

## 2016-08-08 DIAGNOSIS — I130 Hypertensive heart and chronic kidney disease with heart failure and stage 1 through stage 4 chronic kidney disease, or unspecified chronic kidney disease: Secondary | ICD-10-CM | POA: Diagnosis not present

## 2016-08-08 DIAGNOSIS — Z952 Presence of prosthetic heart valve: Secondary | ICD-10-CM | POA: Diagnosis not present

## 2016-08-08 DIAGNOSIS — Z9181 History of falling: Secondary | ICD-10-CM | POA: Diagnosis not present

## 2016-08-08 DIAGNOSIS — E1143 Type 2 diabetes mellitus with diabetic autonomic (poly)neuropathy: Secondary | ICD-10-CM | POA: Diagnosis not present

## 2016-08-08 DIAGNOSIS — N183 Chronic kidney disease, stage 3 (moderate): Secondary | ICD-10-CM | POA: Diagnosis not present

## 2016-08-08 DIAGNOSIS — I87303 Chronic venous hypertension (idiopathic) without complications of bilateral lower extremity: Secondary | ICD-10-CM | POA: Diagnosis not present

## 2016-08-08 DIAGNOSIS — I480 Paroxysmal atrial fibrillation: Secondary | ICD-10-CM | POA: Diagnosis not present

## 2016-08-08 DIAGNOSIS — Z7984 Long term (current) use of oral hypoglycemic drugs: Secondary | ICD-10-CM | POA: Diagnosis not present

## 2016-08-08 DIAGNOSIS — I272 Other secondary pulmonary hypertension: Secondary | ICD-10-CM | POA: Diagnosis not present

## 2016-08-08 DIAGNOSIS — I2510 Atherosclerotic heart disease of native coronary artery without angina pectoris: Secondary | ICD-10-CM | POA: Diagnosis not present

## 2016-08-08 DIAGNOSIS — Z7901 Long term (current) use of anticoagulants: Secondary | ICD-10-CM | POA: Diagnosis not present

## 2016-08-08 DIAGNOSIS — E1122 Type 2 diabetes mellitus with diabetic chronic kidney disease: Secondary | ICD-10-CM | POA: Diagnosis not present

## 2016-08-08 DIAGNOSIS — I5022 Chronic systolic (congestive) heart failure: Secondary | ICD-10-CM | POA: Diagnosis not present

## 2016-08-08 DIAGNOSIS — Z79891 Long term (current) use of opiate analgesic: Secondary | ICD-10-CM | POA: Diagnosis not present

## 2016-08-10 DIAGNOSIS — I480 Paroxysmal atrial fibrillation: Secondary | ICD-10-CM | POA: Diagnosis not present

## 2016-08-10 DIAGNOSIS — Z7901 Long term (current) use of anticoagulants: Secondary | ICD-10-CM | POA: Diagnosis not present

## 2016-08-10 DIAGNOSIS — Z952 Presence of prosthetic heart valve: Secondary | ICD-10-CM | POA: Diagnosis not present

## 2016-08-10 DIAGNOSIS — Z79891 Long term (current) use of opiate analgesic: Secondary | ICD-10-CM | POA: Diagnosis not present

## 2016-08-10 DIAGNOSIS — I87303 Chronic venous hypertension (idiopathic) without complications of bilateral lower extremity: Secondary | ICD-10-CM | POA: Diagnosis not present

## 2016-08-10 DIAGNOSIS — Z7984 Long term (current) use of oral hypoglycemic drugs: Secondary | ICD-10-CM | POA: Diagnosis not present

## 2016-08-10 DIAGNOSIS — I130 Hypertensive heart and chronic kidney disease with heart failure and stage 1 through stage 4 chronic kidney disease, or unspecified chronic kidney disease: Secondary | ICD-10-CM | POA: Diagnosis not present

## 2016-08-10 DIAGNOSIS — I272 Other secondary pulmonary hypertension: Secondary | ICD-10-CM | POA: Diagnosis not present

## 2016-08-10 DIAGNOSIS — I5022 Chronic systolic (congestive) heart failure: Secondary | ICD-10-CM | POA: Diagnosis not present

## 2016-08-10 DIAGNOSIS — N183 Chronic kidney disease, stage 3 (moderate): Secondary | ICD-10-CM | POA: Diagnosis not present

## 2016-08-10 DIAGNOSIS — E1122 Type 2 diabetes mellitus with diabetic chronic kidney disease: Secondary | ICD-10-CM | POA: Diagnosis not present

## 2016-08-10 DIAGNOSIS — Z9181 History of falling: Secondary | ICD-10-CM | POA: Diagnosis not present

## 2016-08-10 DIAGNOSIS — E1143 Type 2 diabetes mellitus with diabetic autonomic (poly)neuropathy: Secondary | ICD-10-CM | POA: Diagnosis not present

## 2016-08-10 DIAGNOSIS — I2510 Atherosclerotic heart disease of native coronary artery without angina pectoris: Secondary | ICD-10-CM | POA: Diagnosis not present

## 2016-08-14 DIAGNOSIS — E1122 Type 2 diabetes mellitus with diabetic chronic kidney disease: Secondary | ICD-10-CM | POA: Diagnosis not present

## 2016-08-14 DIAGNOSIS — I272 Other secondary pulmonary hypertension: Secondary | ICD-10-CM | POA: Diagnosis not present

## 2016-08-14 DIAGNOSIS — E1143 Type 2 diabetes mellitus with diabetic autonomic (poly)neuropathy: Secondary | ICD-10-CM | POA: Diagnosis not present

## 2016-08-14 DIAGNOSIS — I480 Paroxysmal atrial fibrillation: Secondary | ICD-10-CM | POA: Diagnosis not present

## 2016-08-14 DIAGNOSIS — I2510 Atherosclerotic heart disease of native coronary artery without angina pectoris: Secondary | ICD-10-CM | POA: Diagnosis not present

## 2016-08-14 DIAGNOSIS — I5022 Chronic systolic (congestive) heart failure: Secondary | ICD-10-CM | POA: Diagnosis not present

## 2016-08-14 DIAGNOSIS — I87303 Chronic venous hypertension (idiopathic) without complications of bilateral lower extremity: Secondary | ICD-10-CM | POA: Diagnosis not present

## 2016-08-14 DIAGNOSIS — N183 Chronic kidney disease, stage 3 (moderate): Secondary | ICD-10-CM | POA: Diagnosis not present

## 2016-08-14 DIAGNOSIS — Z7984 Long term (current) use of oral hypoglycemic drugs: Secondary | ICD-10-CM | POA: Diagnosis not present

## 2016-08-14 DIAGNOSIS — Z952 Presence of prosthetic heart valve: Secondary | ICD-10-CM | POA: Diagnosis not present

## 2016-08-14 DIAGNOSIS — Z7901 Long term (current) use of anticoagulants: Secondary | ICD-10-CM | POA: Diagnosis not present

## 2016-08-14 DIAGNOSIS — I130 Hypertensive heart and chronic kidney disease with heart failure and stage 1 through stage 4 chronic kidney disease, or unspecified chronic kidney disease: Secondary | ICD-10-CM | POA: Diagnosis not present

## 2016-08-14 DIAGNOSIS — Z9181 History of falling: Secondary | ICD-10-CM | POA: Diagnosis not present

## 2016-08-14 DIAGNOSIS — Z79891 Long term (current) use of opiate analgesic: Secondary | ICD-10-CM | POA: Diagnosis not present

## 2016-08-15 DIAGNOSIS — E1122 Type 2 diabetes mellitus with diabetic chronic kidney disease: Secondary | ICD-10-CM | POA: Diagnosis not present

## 2016-08-15 DIAGNOSIS — Z952 Presence of prosthetic heart valve: Secondary | ICD-10-CM | POA: Diagnosis not present

## 2016-08-15 DIAGNOSIS — I87303 Chronic venous hypertension (idiopathic) without complications of bilateral lower extremity: Secondary | ICD-10-CM | POA: Diagnosis not present

## 2016-08-15 DIAGNOSIS — N183 Chronic kidney disease, stage 3 (moderate): Secondary | ICD-10-CM | POA: Diagnosis not present

## 2016-08-15 DIAGNOSIS — Z9181 History of falling: Secondary | ICD-10-CM | POA: Diagnosis not present

## 2016-08-15 DIAGNOSIS — E1143 Type 2 diabetes mellitus with diabetic autonomic (poly)neuropathy: Secondary | ICD-10-CM | POA: Diagnosis not present

## 2016-08-15 DIAGNOSIS — I130 Hypertensive heart and chronic kidney disease with heart failure and stage 1 through stage 4 chronic kidney disease, or unspecified chronic kidney disease: Secondary | ICD-10-CM | POA: Diagnosis not present

## 2016-08-15 DIAGNOSIS — I2510 Atherosclerotic heart disease of native coronary artery without angina pectoris: Secondary | ICD-10-CM | POA: Diagnosis not present

## 2016-08-15 DIAGNOSIS — I272 Other secondary pulmonary hypertension: Secondary | ICD-10-CM | POA: Diagnosis not present

## 2016-08-15 DIAGNOSIS — I5022 Chronic systolic (congestive) heart failure: Secondary | ICD-10-CM | POA: Diagnosis not present

## 2016-08-15 DIAGNOSIS — I480 Paroxysmal atrial fibrillation: Secondary | ICD-10-CM | POA: Diagnosis not present

## 2016-08-15 DIAGNOSIS — Z7901 Long term (current) use of anticoagulants: Secondary | ICD-10-CM | POA: Diagnosis not present

## 2016-08-15 DIAGNOSIS — Z79891 Long term (current) use of opiate analgesic: Secondary | ICD-10-CM | POA: Diagnosis not present

## 2016-08-15 DIAGNOSIS — Z7984 Long term (current) use of oral hypoglycemic drugs: Secondary | ICD-10-CM | POA: Diagnosis not present

## 2016-08-17 DIAGNOSIS — I5022 Chronic systolic (congestive) heart failure: Secondary | ICD-10-CM | POA: Diagnosis not present

## 2016-08-17 DIAGNOSIS — I87303 Chronic venous hypertension (idiopathic) without complications of bilateral lower extremity: Secondary | ICD-10-CM | POA: Diagnosis not present

## 2016-08-17 DIAGNOSIS — Z952 Presence of prosthetic heart valve: Secondary | ICD-10-CM | POA: Diagnosis not present

## 2016-08-17 DIAGNOSIS — N183 Chronic kidney disease, stage 3 (moderate): Secondary | ICD-10-CM | POA: Diagnosis not present

## 2016-08-17 DIAGNOSIS — Z9181 History of falling: Secondary | ICD-10-CM | POA: Diagnosis not present

## 2016-08-17 DIAGNOSIS — R791 Abnormal coagulation profile: Secondary | ICD-10-CM | POA: Diagnosis not present

## 2016-08-17 DIAGNOSIS — I272 Other secondary pulmonary hypertension: Secondary | ICD-10-CM | POA: Diagnosis not present

## 2016-08-17 DIAGNOSIS — Z7901 Long term (current) use of anticoagulants: Secondary | ICD-10-CM | POA: Diagnosis not present

## 2016-08-17 DIAGNOSIS — I480 Paroxysmal atrial fibrillation: Secondary | ICD-10-CM | POA: Diagnosis not present

## 2016-08-17 DIAGNOSIS — Z79891 Long term (current) use of opiate analgesic: Secondary | ICD-10-CM | POA: Diagnosis not present

## 2016-08-17 DIAGNOSIS — I130 Hypertensive heart and chronic kidney disease with heart failure and stage 1 through stage 4 chronic kidney disease, or unspecified chronic kidney disease: Secondary | ICD-10-CM | POA: Diagnosis not present

## 2016-08-17 DIAGNOSIS — Z7984 Long term (current) use of oral hypoglycemic drugs: Secondary | ICD-10-CM | POA: Diagnosis not present

## 2016-08-17 DIAGNOSIS — E1143 Type 2 diabetes mellitus with diabetic autonomic (poly)neuropathy: Secondary | ICD-10-CM | POA: Diagnosis not present

## 2016-08-17 DIAGNOSIS — E1122 Type 2 diabetes mellitus with diabetic chronic kidney disease: Secondary | ICD-10-CM | POA: Diagnosis not present

## 2016-08-17 DIAGNOSIS — I2510 Atherosclerotic heart disease of native coronary artery without angina pectoris: Secondary | ICD-10-CM | POA: Diagnosis not present

## 2016-08-20 DIAGNOSIS — Z952 Presence of prosthetic heart valve: Secondary | ICD-10-CM | POA: Diagnosis not present

## 2016-08-20 DIAGNOSIS — Z7901 Long term (current) use of anticoagulants: Secondary | ICD-10-CM | POA: Diagnosis not present

## 2016-08-20 DIAGNOSIS — Z79891 Long term (current) use of opiate analgesic: Secondary | ICD-10-CM | POA: Diagnosis not present

## 2016-08-20 DIAGNOSIS — I480 Paroxysmal atrial fibrillation: Secondary | ICD-10-CM | POA: Diagnosis not present

## 2016-08-20 DIAGNOSIS — I130 Hypertensive heart and chronic kidney disease with heart failure and stage 1 through stage 4 chronic kidney disease, or unspecified chronic kidney disease: Secondary | ICD-10-CM | POA: Diagnosis not present

## 2016-08-20 DIAGNOSIS — N183 Chronic kidney disease, stage 3 (moderate): Secondary | ICD-10-CM | POA: Diagnosis not present

## 2016-08-20 DIAGNOSIS — I5022 Chronic systolic (congestive) heart failure: Secondary | ICD-10-CM | POA: Diagnosis not present

## 2016-08-20 DIAGNOSIS — Z9181 History of falling: Secondary | ICD-10-CM | POA: Diagnosis not present

## 2016-08-20 DIAGNOSIS — I87303 Chronic venous hypertension (idiopathic) without complications of bilateral lower extremity: Secondary | ICD-10-CM | POA: Diagnosis not present

## 2016-08-20 DIAGNOSIS — E1122 Type 2 diabetes mellitus with diabetic chronic kidney disease: Secondary | ICD-10-CM | POA: Diagnosis not present

## 2016-08-20 DIAGNOSIS — Z7984 Long term (current) use of oral hypoglycemic drugs: Secondary | ICD-10-CM | POA: Diagnosis not present

## 2016-08-20 DIAGNOSIS — I272 Other secondary pulmonary hypertension: Secondary | ICD-10-CM | POA: Diagnosis not present

## 2016-08-20 DIAGNOSIS — E1143 Type 2 diabetes mellitus with diabetic autonomic (poly)neuropathy: Secondary | ICD-10-CM | POA: Diagnosis not present

## 2016-08-20 DIAGNOSIS — I2510 Atherosclerotic heart disease of native coronary artery without angina pectoris: Secondary | ICD-10-CM | POA: Diagnosis not present

## 2016-08-21 DIAGNOSIS — E1143 Type 2 diabetes mellitus with diabetic autonomic (poly)neuropathy: Secondary | ICD-10-CM | POA: Diagnosis not present

## 2016-08-21 DIAGNOSIS — Z79891 Long term (current) use of opiate analgesic: Secondary | ICD-10-CM | POA: Diagnosis not present

## 2016-08-21 DIAGNOSIS — I87303 Chronic venous hypertension (idiopathic) without complications of bilateral lower extremity: Secondary | ICD-10-CM | POA: Diagnosis not present

## 2016-08-21 DIAGNOSIS — I272 Other secondary pulmonary hypertension: Secondary | ICD-10-CM | POA: Diagnosis not present

## 2016-08-21 DIAGNOSIS — I130 Hypertensive heart and chronic kidney disease with heart failure and stage 1 through stage 4 chronic kidney disease, or unspecified chronic kidney disease: Secondary | ICD-10-CM | POA: Diagnosis not present

## 2016-08-21 DIAGNOSIS — I2510 Atherosclerotic heart disease of native coronary artery without angina pectoris: Secondary | ICD-10-CM | POA: Diagnosis not present

## 2016-08-21 DIAGNOSIS — Z7984 Long term (current) use of oral hypoglycemic drugs: Secondary | ICD-10-CM | POA: Diagnosis not present

## 2016-08-21 DIAGNOSIS — Z9181 History of falling: Secondary | ICD-10-CM | POA: Diagnosis not present

## 2016-08-21 DIAGNOSIS — Z952 Presence of prosthetic heart valve: Secondary | ICD-10-CM | POA: Diagnosis not present

## 2016-08-21 DIAGNOSIS — Z7901 Long term (current) use of anticoagulants: Secondary | ICD-10-CM | POA: Diagnosis not present

## 2016-08-21 DIAGNOSIS — I480 Paroxysmal atrial fibrillation: Secondary | ICD-10-CM | POA: Diagnosis not present

## 2016-08-21 DIAGNOSIS — I5022 Chronic systolic (congestive) heart failure: Secondary | ICD-10-CM | POA: Diagnosis not present

## 2016-08-21 DIAGNOSIS — N183 Chronic kidney disease, stage 3 (moderate): Secondary | ICD-10-CM | POA: Diagnosis not present

## 2016-08-21 DIAGNOSIS — E1122 Type 2 diabetes mellitus with diabetic chronic kidney disease: Secondary | ICD-10-CM | POA: Diagnosis not present

## 2016-08-22 DIAGNOSIS — H10023 Other mucopurulent conjunctivitis, bilateral: Secondary | ICD-10-CM | POA: Diagnosis not present

## 2016-08-23 DIAGNOSIS — E1143 Type 2 diabetes mellitus with diabetic autonomic (poly)neuropathy: Secondary | ICD-10-CM | POA: Diagnosis not present

## 2016-08-23 DIAGNOSIS — I87303 Chronic venous hypertension (idiopathic) without complications of bilateral lower extremity: Secondary | ICD-10-CM | POA: Diagnosis not present

## 2016-08-23 DIAGNOSIS — I5022 Chronic systolic (congestive) heart failure: Secondary | ICD-10-CM | POA: Diagnosis not present

## 2016-08-23 DIAGNOSIS — I2510 Atherosclerotic heart disease of native coronary artery without angina pectoris: Secondary | ICD-10-CM | POA: Diagnosis not present

## 2016-08-23 DIAGNOSIS — Z7984 Long term (current) use of oral hypoglycemic drugs: Secondary | ICD-10-CM | POA: Diagnosis not present

## 2016-08-23 DIAGNOSIS — Z79891 Long term (current) use of opiate analgesic: Secondary | ICD-10-CM | POA: Diagnosis not present

## 2016-08-23 DIAGNOSIS — Z7901 Long term (current) use of anticoagulants: Secondary | ICD-10-CM | POA: Diagnosis not present

## 2016-08-23 DIAGNOSIS — E1122 Type 2 diabetes mellitus with diabetic chronic kidney disease: Secondary | ICD-10-CM | POA: Diagnosis not present

## 2016-08-23 DIAGNOSIS — Z952 Presence of prosthetic heart valve: Secondary | ICD-10-CM | POA: Diagnosis not present

## 2016-08-23 DIAGNOSIS — I480 Paroxysmal atrial fibrillation: Secondary | ICD-10-CM | POA: Diagnosis not present

## 2016-08-23 DIAGNOSIS — I130 Hypertensive heart and chronic kidney disease with heart failure and stage 1 through stage 4 chronic kidney disease, or unspecified chronic kidney disease: Secondary | ICD-10-CM | POA: Diagnosis not present

## 2016-08-23 DIAGNOSIS — I272 Other secondary pulmonary hypertension: Secondary | ICD-10-CM | POA: Diagnosis not present

## 2016-08-23 DIAGNOSIS — Z9181 History of falling: Secondary | ICD-10-CM | POA: Diagnosis not present

## 2016-08-23 DIAGNOSIS — N183 Chronic kidney disease, stage 3 (moderate): Secondary | ICD-10-CM | POA: Diagnosis not present

## 2016-08-24 DIAGNOSIS — E1129 Type 2 diabetes mellitus with other diabetic kidney complication: Secondary | ICD-10-CM | POA: Diagnosis not present

## 2016-08-24 DIAGNOSIS — Z79899 Other long term (current) drug therapy: Secondary | ICD-10-CM | POA: Diagnosis not present

## 2016-08-24 DIAGNOSIS — N183 Chronic kidney disease, stage 3 (moderate): Secondary | ICD-10-CM | POA: Diagnosis not present

## 2016-08-24 DIAGNOSIS — I10 Essential (primary) hypertension: Secondary | ICD-10-CM | POA: Diagnosis not present

## 2016-08-24 DIAGNOSIS — E782 Mixed hyperlipidemia: Secondary | ICD-10-CM | POA: Diagnosis not present

## 2016-08-24 DIAGNOSIS — M109 Gout, unspecified: Secondary | ICD-10-CM | POA: Diagnosis not present

## 2016-08-27 DIAGNOSIS — I87303 Chronic venous hypertension (idiopathic) without complications of bilateral lower extremity: Secondary | ICD-10-CM | POA: Diagnosis not present

## 2016-08-27 DIAGNOSIS — I130 Hypertensive heart and chronic kidney disease with heart failure and stage 1 through stage 4 chronic kidney disease, or unspecified chronic kidney disease: Secondary | ICD-10-CM | POA: Diagnosis not present

## 2016-08-27 DIAGNOSIS — Z79891 Long term (current) use of opiate analgesic: Secondary | ICD-10-CM | POA: Diagnosis not present

## 2016-08-27 DIAGNOSIS — E1143 Type 2 diabetes mellitus with diabetic autonomic (poly)neuropathy: Secondary | ICD-10-CM | POA: Diagnosis not present

## 2016-08-27 DIAGNOSIS — Z9181 History of falling: Secondary | ICD-10-CM | POA: Diagnosis not present

## 2016-08-27 DIAGNOSIS — Z7901 Long term (current) use of anticoagulants: Secondary | ICD-10-CM | POA: Diagnosis not present

## 2016-08-27 DIAGNOSIS — Z7984 Long term (current) use of oral hypoglycemic drugs: Secondary | ICD-10-CM | POA: Diagnosis not present

## 2016-08-27 DIAGNOSIS — N183 Chronic kidney disease, stage 3 (moderate): Secondary | ICD-10-CM | POA: Diagnosis not present

## 2016-08-27 DIAGNOSIS — Z952 Presence of prosthetic heart valve: Secondary | ICD-10-CM | POA: Diagnosis not present

## 2016-08-27 DIAGNOSIS — I2510 Atherosclerotic heart disease of native coronary artery without angina pectoris: Secondary | ICD-10-CM | POA: Diagnosis not present

## 2016-08-27 DIAGNOSIS — I5022 Chronic systolic (congestive) heart failure: Secondary | ICD-10-CM | POA: Diagnosis not present

## 2016-08-27 DIAGNOSIS — I272 Other secondary pulmonary hypertension: Secondary | ICD-10-CM | POA: Diagnosis not present

## 2016-08-27 DIAGNOSIS — E1122 Type 2 diabetes mellitus with diabetic chronic kidney disease: Secondary | ICD-10-CM | POA: Diagnosis not present

## 2016-08-27 DIAGNOSIS — I480 Paroxysmal atrial fibrillation: Secondary | ICD-10-CM | POA: Diagnosis not present

## 2016-08-28 DIAGNOSIS — I272 Other secondary pulmonary hypertension: Secondary | ICD-10-CM | POA: Diagnosis not present

## 2016-08-28 DIAGNOSIS — Z952 Presence of prosthetic heart valve: Secondary | ICD-10-CM | POA: Diagnosis not present

## 2016-08-28 DIAGNOSIS — Z7984 Long term (current) use of oral hypoglycemic drugs: Secondary | ICD-10-CM | POA: Diagnosis not present

## 2016-08-28 DIAGNOSIS — I130 Hypertensive heart and chronic kidney disease with heart failure and stage 1 through stage 4 chronic kidney disease, or unspecified chronic kidney disease: Secondary | ICD-10-CM | POA: Diagnosis not present

## 2016-08-28 DIAGNOSIS — N183 Chronic kidney disease, stage 3 (moderate): Secondary | ICD-10-CM | POA: Diagnosis not present

## 2016-08-28 DIAGNOSIS — I5022 Chronic systolic (congestive) heart failure: Secondary | ICD-10-CM | POA: Diagnosis not present

## 2016-08-28 DIAGNOSIS — E1143 Type 2 diabetes mellitus with diabetic autonomic (poly)neuropathy: Secondary | ICD-10-CM | POA: Diagnosis not present

## 2016-08-28 DIAGNOSIS — E1122 Type 2 diabetes mellitus with diabetic chronic kidney disease: Secondary | ICD-10-CM | POA: Diagnosis not present

## 2016-08-28 DIAGNOSIS — Z9181 History of falling: Secondary | ICD-10-CM | POA: Diagnosis not present

## 2016-08-28 DIAGNOSIS — I87303 Chronic venous hypertension (idiopathic) without complications of bilateral lower extremity: Secondary | ICD-10-CM | POA: Diagnosis not present

## 2016-08-28 DIAGNOSIS — Z7901 Long term (current) use of anticoagulants: Secondary | ICD-10-CM | POA: Diagnosis not present

## 2016-08-28 DIAGNOSIS — I2510 Atherosclerotic heart disease of native coronary artery without angina pectoris: Secondary | ICD-10-CM | POA: Diagnosis not present

## 2016-08-28 DIAGNOSIS — Z79891 Long term (current) use of opiate analgesic: Secondary | ICD-10-CM | POA: Diagnosis not present

## 2016-08-28 DIAGNOSIS — I480 Paroxysmal atrial fibrillation: Secondary | ICD-10-CM | POA: Diagnosis not present

## 2016-08-30 DIAGNOSIS — Z7984 Long term (current) use of oral hypoglycemic drugs: Secondary | ICD-10-CM | POA: Diagnosis not present

## 2016-08-30 DIAGNOSIS — I5022 Chronic systolic (congestive) heart failure: Secondary | ICD-10-CM | POA: Diagnosis not present

## 2016-08-30 DIAGNOSIS — I130 Hypertensive heart and chronic kidney disease with heart failure and stage 1 through stage 4 chronic kidney disease, or unspecified chronic kidney disease: Secondary | ICD-10-CM | POA: Diagnosis not present

## 2016-08-30 DIAGNOSIS — E1122 Type 2 diabetes mellitus with diabetic chronic kidney disease: Secondary | ICD-10-CM | POA: Diagnosis not present

## 2016-08-30 DIAGNOSIS — N183 Chronic kidney disease, stage 3 (moderate): Secondary | ICD-10-CM | POA: Diagnosis not present

## 2016-08-30 DIAGNOSIS — E1143 Type 2 diabetes mellitus with diabetic autonomic (poly)neuropathy: Secondary | ICD-10-CM | POA: Diagnosis not present

## 2016-08-30 DIAGNOSIS — I2510 Atherosclerotic heart disease of native coronary artery without angina pectoris: Secondary | ICD-10-CM | POA: Diagnosis not present

## 2016-08-30 DIAGNOSIS — Z952 Presence of prosthetic heart valve: Secondary | ICD-10-CM | POA: Diagnosis not present

## 2016-08-30 DIAGNOSIS — Z7901 Long term (current) use of anticoagulants: Secondary | ICD-10-CM | POA: Diagnosis not present

## 2016-08-30 DIAGNOSIS — I87303 Chronic venous hypertension (idiopathic) without complications of bilateral lower extremity: Secondary | ICD-10-CM | POA: Diagnosis not present

## 2016-08-30 DIAGNOSIS — I480 Paroxysmal atrial fibrillation: Secondary | ICD-10-CM | POA: Diagnosis not present

## 2016-08-30 DIAGNOSIS — Z79891 Long term (current) use of opiate analgesic: Secondary | ICD-10-CM | POA: Diagnosis not present

## 2016-08-30 DIAGNOSIS — I272 Other secondary pulmonary hypertension: Secondary | ICD-10-CM | POA: Diagnosis not present

## 2016-08-30 DIAGNOSIS — Z9181 History of falling: Secondary | ICD-10-CM | POA: Diagnosis not present

## 2016-08-31 DIAGNOSIS — I509 Heart failure, unspecified: Secondary | ICD-10-CM | POA: Diagnosis not present

## 2016-08-31 DIAGNOSIS — E1143 Type 2 diabetes mellitus with diabetic autonomic (poly)neuropathy: Secondary | ICD-10-CM | POA: Diagnosis not present

## 2016-08-31 DIAGNOSIS — M21171 Varus deformity, not elsewhere classified, right ankle: Secondary | ICD-10-CM | POA: Diagnosis not present

## 2016-09-03 DIAGNOSIS — I87303 Chronic venous hypertension (idiopathic) without complications of bilateral lower extremity: Secondary | ICD-10-CM | POA: Diagnosis not present

## 2016-09-03 DIAGNOSIS — I2510 Atherosclerotic heart disease of native coronary artery without angina pectoris: Secondary | ICD-10-CM | POA: Diagnosis not present

## 2016-09-03 DIAGNOSIS — N183 Chronic kidney disease, stage 3 (moderate): Secondary | ICD-10-CM | POA: Diagnosis not present

## 2016-09-03 DIAGNOSIS — I130 Hypertensive heart and chronic kidney disease with heart failure and stage 1 through stage 4 chronic kidney disease, or unspecified chronic kidney disease: Secondary | ICD-10-CM | POA: Diagnosis not present

## 2016-09-03 DIAGNOSIS — Z7984 Long term (current) use of oral hypoglycemic drugs: Secondary | ICD-10-CM | POA: Diagnosis not present

## 2016-09-03 DIAGNOSIS — I480 Paroxysmal atrial fibrillation: Secondary | ICD-10-CM | POA: Diagnosis not present

## 2016-09-03 DIAGNOSIS — E1143 Type 2 diabetes mellitus with diabetic autonomic (poly)neuropathy: Secondary | ICD-10-CM | POA: Diagnosis not present

## 2016-09-03 DIAGNOSIS — I2729 Other secondary pulmonary hypertension: Secondary | ICD-10-CM | POA: Diagnosis not present

## 2016-09-03 DIAGNOSIS — E1122 Type 2 diabetes mellitus with diabetic chronic kidney disease: Secondary | ICD-10-CM | POA: Diagnosis not present

## 2016-09-03 DIAGNOSIS — I5022 Chronic systolic (congestive) heart failure: Secondary | ICD-10-CM | POA: Diagnosis not present

## 2016-09-03 DIAGNOSIS — Z7901 Long term (current) use of anticoagulants: Secondary | ICD-10-CM | POA: Diagnosis not present

## 2016-09-03 DIAGNOSIS — Z9181 History of falling: Secondary | ICD-10-CM | POA: Diagnosis not present

## 2016-09-03 DIAGNOSIS — Z952 Presence of prosthetic heart valve: Secondary | ICD-10-CM | POA: Diagnosis not present

## 2016-09-03 DIAGNOSIS — Z79891 Long term (current) use of opiate analgesic: Secondary | ICD-10-CM | POA: Diagnosis not present

## 2016-09-04 DIAGNOSIS — Z952 Presence of prosthetic heart valve: Secondary | ICD-10-CM | POA: Diagnosis not present

## 2016-09-04 DIAGNOSIS — I2510 Atherosclerotic heart disease of native coronary artery without angina pectoris: Secondary | ICD-10-CM | POA: Diagnosis not present

## 2016-09-04 DIAGNOSIS — I87303 Chronic venous hypertension (idiopathic) without complications of bilateral lower extremity: Secondary | ICD-10-CM | POA: Diagnosis not present

## 2016-09-04 DIAGNOSIS — I130 Hypertensive heart and chronic kidney disease with heart failure and stage 1 through stage 4 chronic kidney disease, or unspecified chronic kidney disease: Secondary | ICD-10-CM | POA: Diagnosis not present

## 2016-09-04 DIAGNOSIS — N183 Chronic kidney disease, stage 3 (moderate): Secondary | ICD-10-CM | POA: Diagnosis not present

## 2016-09-04 DIAGNOSIS — Z79891 Long term (current) use of opiate analgesic: Secondary | ICD-10-CM | POA: Diagnosis not present

## 2016-09-04 DIAGNOSIS — Z9181 History of falling: Secondary | ICD-10-CM | POA: Diagnosis not present

## 2016-09-04 DIAGNOSIS — E1122 Type 2 diabetes mellitus with diabetic chronic kidney disease: Secondary | ICD-10-CM | POA: Diagnosis not present

## 2016-09-04 DIAGNOSIS — I5022 Chronic systolic (congestive) heart failure: Secondary | ICD-10-CM | POA: Diagnosis not present

## 2016-09-04 DIAGNOSIS — E1143 Type 2 diabetes mellitus with diabetic autonomic (poly)neuropathy: Secondary | ICD-10-CM | POA: Diagnosis not present

## 2016-09-04 DIAGNOSIS — Z7984 Long term (current) use of oral hypoglycemic drugs: Secondary | ICD-10-CM | POA: Diagnosis not present

## 2016-09-04 DIAGNOSIS — Z7901 Long term (current) use of anticoagulants: Secondary | ICD-10-CM | POA: Diagnosis not present

## 2016-09-04 DIAGNOSIS — I480 Paroxysmal atrial fibrillation: Secondary | ICD-10-CM | POA: Diagnosis not present

## 2016-09-04 DIAGNOSIS — I2729 Other secondary pulmonary hypertension: Secondary | ICD-10-CM | POA: Diagnosis not present

## 2016-09-06 DIAGNOSIS — Z79891 Long term (current) use of opiate analgesic: Secondary | ICD-10-CM | POA: Diagnosis not present

## 2016-09-06 DIAGNOSIS — E1122 Type 2 diabetes mellitus with diabetic chronic kidney disease: Secondary | ICD-10-CM | POA: Diagnosis not present

## 2016-09-06 DIAGNOSIS — I130 Hypertensive heart and chronic kidney disease with heart failure and stage 1 through stage 4 chronic kidney disease, or unspecified chronic kidney disease: Secondary | ICD-10-CM | POA: Diagnosis not present

## 2016-09-06 DIAGNOSIS — Z9181 History of falling: Secondary | ICD-10-CM | POA: Diagnosis not present

## 2016-09-06 DIAGNOSIS — I2729 Other secondary pulmonary hypertension: Secondary | ICD-10-CM | POA: Diagnosis not present

## 2016-09-06 DIAGNOSIS — E1143 Type 2 diabetes mellitus with diabetic autonomic (poly)neuropathy: Secondary | ICD-10-CM | POA: Diagnosis not present

## 2016-09-06 DIAGNOSIS — I2510 Atherosclerotic heart disease of native coronary artery without angina pectoris: Secondary | ICD-10-CM | POA: Diagnosis not present

## 2016-09-06 DIAGNOSIS — N183 Chronic kidney disease, stage 3 (moderate): Secondary | ICD-10-CM | POA: Diagnosis not present

## 2016-09-06 DIAGNOSIS — Z952 Presence of prosthetic heart valve: Secondary | ICD-10-CM | POA: Diagnosis not present

## 2016-09-06 DIAGNOSIS — I5022 Chronic systolic (congestive) heart failure: Secondary | ICD-10-CM | POA: Diagnosis not present

## 2016-09-06 DIAGNOSIS — I480 Paroxysmal atrial fibrillation: Secondary | ICD-10-CM | POA: Diagnosis not present

## 2016-09-06 DIAGNOSIS — Z7984 Long term (current) use of oral hypoglycemic drugs: Secondary | ICD-10-CM | POA: Diagnosis not present

## 2016-09-06 DIAGNOSIS — Z7901 Long term (current) use of anticoagulants: Secondary | ICD-10-CM | POA: Diagnosis not present

## 2016-09-06 DIAGNOSIS — I87303 Chronic venous hypertension (idiopathic) without complications of bilateral lower extremity: Secondary | ICD-10-CM | POA: Diagnosis not present

## 2016-09-11 DIAGNOSIS — I130 Hypertensive heart and chronic kidney disease with heart failure and stage 1 through stage 4 chronic kidney disease, or unspecified chronic kidney disease: Secondary | ICD-10-CM | POA: Diagnosis not present

## 2016-09-11 DIAGNOSIS — Z7901 Long term (current) use of anticoagulants: Secondary | ICD-10-CM | POA: Diagnosis not present

## 2016-09-11 DIAGNOSIS — I2729 Other secondary pulmonary hypertension: Secondary | ICD-10-CM | POA: Diagnosis not present

## 2016-09-11 DIAGNOSIS — Z952 Presence of prosthetic heart valve: Secondary | ICD-10-CM | POA: Diagnosis not present

## 2016-09-11 DIAGNOSIS — N183 Chronic kidney disease, stage 3 (moderate): Secondary | ICD-10-CM | POA: Diagnosis not present

## 2016-09-11 DIAGNOSIS — E1143 Type 2 diabetes mellitus with diabetic autonomic (poly)neuropathy: Secondary | ICD-10-CM | POA: Diagnosis not present

## 2016-09-11 DIAGNOSIS — I5022 Chronic systolic (congestive) heart failure: Secondary | ICD-10-CM | POA: Diagnosis not present

## 2016-09-11 DIAGNOSIS — Z9181 History of falling: Secondary | ICD-10-CM | POA: Diagnosis not present

## 2016-09-11 DIAGNOSIS — I87303 Chronic venous hypertension (idiopathic) without complications of bilateral lower extremity: Secondary | ICD-10-CM | POA: Diagnosis not present

## 2016-09-11 DIAGNOSIS — Z7984 Long term (current) use of oral hypoglycemic drugs: Secondary | ICD-10-CM | POA: Diagnosis not present

## 2016-09-11 DIAGNOSIS — I2510 Atherosclerotic heart disease of native coronary artery without angina pectoris: Secondary | ICD-10-CM | POA: Diagnosis not present

## 2016-09-11 DIAGNOSIS — Z79891 Long term (current) use of opiate analgesic: Secondary | ICD-10-CM | POA: Diagnosis not present

## 2016-09-11 DIAGNOSIS — I480 Paroxysmal atrial fibrillation: Secondary | ICD-10-CM | POA: Diagnosis not present

## 2016-09-11 DIAGNOSIS — E1122 Type 2 diabetes mellitus with diabetic chronic kidney disease: Secondary | ICD-10-CM | POA: Diagnosis not present

## 2016-09-12 DIAGNOSIS — H10023 Other mucopurulent conjunctivitis, bilateral: Secondary | ICD-10-CM | POA: Diagnosis not present

## 2016-09-13 DIAGNOSIS — Z7901 Long term (current) use of anticoagulants: Secondary | ICD-10-CM | POA: Diagnosis not present

## 2016-09-13 DIAGNOSIS — E876 Hypokalemia: Secondary | ICD-10-CM | POA: Diagnosis not present

## 2016-09-14 DIAGNOSIS — Z7901 Long term (current) use of anticoagulants: Secondary | ICD-10-CM | POA: Diagnosis not present

## 2016-09-14 DIAGNOSIS — I2729 Other secondary pulmonary hypertension: Secondary | ICD-10-CM | POA: Diagnosis not present

## 2016-09-14 DIAGNOSIS — I2510 Atherosclerotic heart disease of native coronary artery without angina pectoris: Secondary | ICD-10-CM | POA: Diagnosis not present

## 2016-09-14 DIAGNOSIS — I480 Paroxysmal atrial fibrillation: Secondary | ICD-10-CM | POA: Diagnosis not present

## 2016-09-14 DIAGNOSIS — I87303 Chronic venous hypertension (idiopathic) without complications of bilateral lower extremity: Secondary | ICD-10-CM | POA: Diagnosis not present

## 2016-09-14 DIAGNOSIS — Z7984 Long term (current) use of oral hypoglycemic drugs: Secondary | ICD-10-CM | POA: Diagnosis not present

## 2016-09-14 DIAGNOSIS — E1122 Type 2 diabetes mellitus with diabetic chronic kidney disease: Secondary | ICD-10-CM | POA: Diagnosis not present

## 2016-09-14 DIAGNOSIS — N183 Chronic kidney disease, stage 3 (moderate): Secondary | ICD-10-CM | POA: Diagnosis not present

## 2016-09-14 DIAGNOSIS — I130 Hypertensive heart and chronic kidney disease with heart failure and stage 1 through stage 4 chronic kidney disease, or unspecified chronic kidney disease: Secondary | ICD-10-CM | POA: Diagnosis not present

## 2016-09-14 DIAGNOSIS — Z9181 History of falling: Secondary | ICD-10-CM | POA: Diagnosis not present

## 2016-09-14 DIAGNOSIS — Z952 Presence of prosthetic heart valve: Secondary | ICD-10-CM | POA: Diagnosis not present

## 2016-09-14 DIAGNOSIS — I5022 Chronic systolic (congestive) heart failure: Secondary | ICD-10-CM | POA: Diagnosis not present

## 2016-09-14 DIAGNOSIS — E1143 Type 2 diabetes mellitus with diabetic autonomic (poly)neuropathy: Secondary | ICD-10-CM | POA: Diagnosis not present

## 2016-09-14 DIAGNOSIS — Z79891 Long term (current) use of opiate analgesic: Secondary | ICD-10-CM | POA: Diagnosis not present

## 2016-09-30 DIAGNOSIS — M21171 Varus deformity, not elsewhere classified, right ankle: Secondary | ICD-10-CM | POA: Diagnosis not present

## 2016-09-30 DIAGNOSIS — E1143 Type 2 diabetes mellitus with diabetic autonomic (poly)neuropathy: Secondary | ICD-10-CM | POA: Diagnosis not present

## 2016-09-30 DIAGNOSIS — I509 Heart failure, unspecified: Secondary | ICD-10-CM | POA: Diagnosis not present

## 2017-06-15 ENCOUNTER — Emergency Department (HOSPITAL_COMMUNITY): Payer: Medicare Other

## 2017-06-15 ENCOUNTER — Inpatient Hospital Stay (HOSPITAL_COMMUNITY)
Admission: EM | Admit: 2017-06-15 | Discharge: 2017-06-19 | DRG: 871 | Disposition: A | Discharge status: Skilled Nursing Facility | Payer: Medicare Other | Attending: Family Medicine | Admitting: Family Medicine

## 2017-06-15 DIAGNOSIS — I130 Hypertensive heart and chronic kidney disease with heart failure and stage 1 through stage 4 chronic kidney disease, or unspecified chronic kidney disease: Secondary | ICD-10-CM | POA: Diagnosis present

## 2017-06-15 DIAGNOSIS — R748 Abnormal levels of other serum enzymes: Secondary | ICD-10-CM | POA: Diagnosis present

## 2017-06-15 DIAGNOSIS — N183 Chronic kidney disease, stage 3 (moderate): Secondary | ICD-10-CM | POA: Diagnosis present

## 2017-06-15 DIAGNOSIS — R4182 Altered mental status, unspecified: Secondary | ICD-10-CM

## 2017-06-15 DIAGNOSIS — Z79899 Other long term (current) drug therapy: Secondary | ICD-10-CM

## 2017-06-15 DIAGNOSIS — N390 Urinary tract infection, site not specified: Secondary | ICD-10-CM | POA: Diagnosis present

## 2017-06-15 DIAGNOSIS — L03115 Cellulitis of right lower limb: Secondary | ICD-10-CM | POA: Diagnosis present

## 2017-06-15 DIAGNOSIS — Z66 Do not resuscitate: Secondary | ICD-10-CM | POA: Diagnosis present

## 2017-06-15 DIAGNOSIS — Z7984 Long term (current) use of oral hypoglycemic drugs: Secondary | ICD-10-CM

## 2017-06-15 DIAGNOSIS — I447 Left bundle-branch block, unspecified: Secondary | ICD-10-CM | POA: Diagnosis present

## 2017-06-15 DIAGNOSIS — A419 Sepsis, unspecified organism: Principal | ICD-10-CM | POA: Diagnosis present

## 2017-06-15 DIAGNOSIS — E1122 Type 2 diabetes mellitus with diabetic chronic kidney disease: Secondary | ICD-10-CM | POA: Diagnosis present

## 2017-06-15 DIAGNOSIS — G9341 Metabolic encephalopathy: Secondary | ICD-10-CM | POA: Diagnosis present

## 2017-06-15 DIAGNOSIS — I2721 Secondary pulmonary arterial hypertension: Secondary | ICD-10-CM | POA: Diagnosis present

## 2017-06-15 DIAGNOSIS — E785 Hyperlipidemia, unspecified: Secondary | ICD-10-CM | POA: Diagnosis present

## 2017-06-15 DIAGNOSIS — R682 Dry mouth, unspecified: Secondary | ICD-10-CM | POA: Diagnosis present

## 2017-06-15 DIAGNOSIS — I361 Nonrheumatic tricuspid (valve) insufficiency: Secondary | ICD-10-CM | POA: Diagnosis present

## 2017-06-15 DIAGNOSIS — I480 Paroxysmal atrial fibrillation: Secondary | ICD-10-CM | POA: Diagnosis present

## 2017-06-15 DIAGNOSIS — Z7901 Long term (current) use of anticoagulants: Secondary | ICD-10-CM

## 2017-06-15 DIAGNOSIS — I959 Hypotension, unspecified: Secondary | ICD-10-CM | POA: Diagnosis present

## 2017-06-15 DIAGNOSIS — Z952 Presence of prosthetic heart valve: Secondary | ICD-10-CM | POA: Diagnosis not present

## 2017-06-15 DIAGNOSIS — M810 Age-related osteoporosis without current pathological fracture: Secondary | ICD-10-CM | POA: Diagnosis present

## 2017-06-15 DIAGNOSIS — N3000 Acute cystitis without hematuria: Secondary | ICD-10-CM | POA: Diagnosis not present

## 2017-06-15 DIAGNOSIS — M109 Gout, unspecified: Secondary | ICD-10-CM | POA: Diagnosis present

## 2017-06-15 DIAGNOSIS — I5032 Chronic diastolic (congestive) heart failure: Secondary | ICD-10-CM | POA: Diagnosis present

## 2017-06-15 DIAGNOSIS — I482 Chronic atrial fibrillation: Secondary | ICD-10-CM

## 2017-06-15 DIAGNOSIS — Z8673 Personal history of transient ischemic attack (TIA), and cerebral infarction without residual deficits: Secondary | ICD-10-CM

## 2017-06-15 DIAGNOSIS — F419 Anxiety disorder, unspecified: Secondary | ICD-10-CM | POA: Diagnosis present

## 2017-06-15 DIAGNOSIS — J9811 Atelectasis: Secondary | ICD-10-CM | POA: Diagnosis present

## 2017-06-15 DIAGNOSIS — L0390 Cellulitis, unspecified: Secondary | ICD-10-CM | POA: Diagnosis present

## 2017-06-15 DIAGNOSIS — R531 Weakness: Secondary | ICD-10-CM

## 2017-06-15 DIAGNOSIS — G9340 Encephalopathy, unspecified: Secondary | ICD-10-CM | POA: Diagnosis not present

## 2017-06-15 LAB — I-STAT CG4 LACTIC ACID, ED: LACTIC ACID, VENOUS: 1.61 mmol/L (ref 0.5–1.9)

## 2017-06-15 LAB — COMPREHENSIVE METABOLIC PANEL
ALBUMIN: 3.6 g/dL (ref 3.5–5.0)
ALT: 14 U/L (ref 14–54)
ANION GAP: 12 (ref 5–15)
AST: 28 U/L (ref 15–41)
Alkaline Phosphatase: 62 U/L (ref 38–126)
BUN: 64 mg/dL — ABNORMAL HIGH (ref 6–20)
CHLORIDE: 97 mmol/L — AB (ref 101–111)
CO2: 28 mmol/L (ref 22–32)
Calcium: 9.6 mg/dL (ref 8.9–10.3)
Creatinine, Ser: 1.93 mg/dL — ABNORMAL HIGH (ref 0.44–1.00)
GFR calc Af Amer: 25 mL/min — ABNORMAL LOW (ref >60)
GFR calc non Af Amer: 22 mL/min — ABNORMAL LOW (ref >60)
GLUCOSE: 139 mg/dL — AB (ref 65–99)
POTASSIUM: 4.1 mmol/L (ref 3.5–5.1)
Sodium: 137 mmol/L (ref 135–145)
TOTAL PROTEIN: 7.3 g/dL (ref 6.5–8.1)
Total Bilirubin: 0.8 mg/dL (ref 0.3–1.2)

## 2017-06-15 LAB — I-STAT TROPONIN, ED: Troponin i, poc: 0.13 ng/mL (ref 0.00–0.08)

## 2017-06-15 LAB — URINALYSIS, ROUTINE W REFLEX MICROSCOPIC
Bilirubin Urine: NEGATIVE
GLUCOSE, UA: NEGATIVE mg/dL
KETONES UR: NEGATIVE mg/dL
Nitrite: NEGATIVE
PH: 7 (ref 5.0–8.0)
PROTEIN: 100 mg/dL — AB
Specific Gravity, Urine: 1.009 (ref 1.005–1.030)

## 2017-06-15 LAB — PROTIME-INR
INR: 2.13
Prothrombin Time: 24.2 seconds — ABNORMAL HIGH (ref 11.4–15.2)

## 2017-06-15 LAB — GLUCOSE, CAPILLARY: Glucose-Capillary: 127 mg/dL — ABNORMAL HIGH (ref 65–99)

## 2017-06-15 LAB — I-STAT CHEM 8, ED
BUN: 59 mg/dL — ABNORMAL HIGH (ref 6–20)
CREATININE: 1.7 mg/dL — AB (ref 0.44–1.00)
Calcium, Ion: 1.02 mmol/L — ABNORMAL LOW (ref 1.15–1.40)
Chloride: 96 mmol/L — ABNORMAL LOW (ref 101–111)
Glucose, Bld: 137 mg/dL — ABNORMAL HIGH (ref 65–99)
HEMATOCRIT: 39 % (ref 36.0–46.0)
HEMOGLOBIN: 13.3 g/dL (ref 12.0–15.0)
Potassium: 3.9 mmol/L (ref 3.5–5.1)
SODIUM: 139 mmol/L (ref 135–145)
TCO2: 30 mmol/L (ref 0–100)

## 2017-06-15 LAB — LIPID PANEL
CHOLESTEROL: 86 mg/dL (ref 0–200)
HDL: 41 mg/dL (ref >40)
LDL CALC: 34 mg/dL (ref 0–99)
TRIGLYCERIDES: 56 mg/dL (ref <150)
Total CHOL/HDL Ratio: 2.1 RATIO
VLDL: 11 mg/dL (ref 0–40)

## 2017-06-15 LAB — DIFFERENTIAL
BASOS PCT: 0 %
Basophils Absolute: 0 10*3/uL (ref 0.0–0.1)
EOS ABS: 0 10*3/uL (ref 0.0–0.7)
EOS PCT: 0 %
Lymphocytes Relative: 4 %
Lymphs Abs: 0.7 10*3/uL (ref 0.7–4.0)
Monocytes Absolute: 0.8 10*3/uL (ref 0.1–1.0)
Monocytes Relative: 5 %
NEUTROS PCT: 91 %
Neutro Abs: 15.7 10*3/uL — ABNORMAL HIGH (ref 1.7–7.7)

## 2017-06-15 LAB — TSH: TSH: 1.365 u[IU]/mL (ref 0.350–4.500)

## 2017-06-15 LAB — CBC
HCT: 36.2 % (ref 36.0–46.0)
Hemoglobin: 12.1 g/dL (ref 12.0–15.0)
MCH: 32.6 pg (ref 26.0–34.0)
MCHC: 33.4 g/dL (ref 30.0–36.0)
MCV: 97.6 fL (ref 78.0–100.0)
Platelets: 141 10*3/uL — ABNORMAL LOW (ref 150–400)
RBC: 3.71 MIL/uL — ABNORMAL LOW (ref 3.87–5.11)
RDW: 15.2 % (ref 11.5–15.5)
WBC: 17.3 10*3/uL — ABNORMAL HIGH (ref 4.0–10.5)

## 2017-06-15 LAB — CBG MONITORING, ED: GLUCOSE-CAPILLARY: 139 mg/dL — AB (ref 65–99)

## 2017-06-15 LAB — TROPONIN I: TROPONIN I: 0.18 ng/mL — AB (ref <0.03)

## 2017-06-15 LAB — APTT: APTT: 45 s — AB (ref 24–36)

## 2017-06-15 LAB — BRAIN NATRIURETIC PEPTIDE: B Natriuretic Peptide: 2371.8 pg/mL — ABNORMAL HIGH (ref 0.0–100.0)

## 2017-06-15 LAB — ETHANOL

## 2017-06-15 MED ORDER — SODIUM CHLORIDE 0.9 % IV SOLN
1000.0000 mL | INTRAVENOUS | Status: DC
  Administered 2017-06-15: 1000 mL via INTRAVENOUS

## 2017-06-15 MED ORDER — PIPERACILLIN-TAZOBACTAM 3.375 G IVPB 30 MIN
3.3750 g | Freq: Once | INTRAVENOUS | Status: AC
  Administered 2017-06-15: 3.375 g via INTRAVENOUS
  Filled 2017-06-15: qty 50

## 2017-06-15 MED ORDER — ACETAMINOPHEN 650 MG RE SUPP: 650.0000 mg | Freq: Four times a day (QID) | RECTAL | Status: DC | PRN

## 2017-06-15 MED ORDER — VANCOMYCIN HCL 10 G IV SOLR
1500.0000 mg | Freq: Once | INTRAVENOUS | Status: DC
  Filled 2017-06-15: qty 1500

## 2017-06-15 MED ORDER — VANCOMYCIN HCL 500 MG IV SOLR
500.0000 mg | Freq: Once | INTRAVENOUS | Status: AC
  Administered 2017-06-15: 500 mg via INTRAVENOUS
  Filled 2017-06-15: qty 500

## 2017-06-15 MED ORDER — SODIUM CHLORIDE 0.9 % IV BOLUS (SEPSIS)
500.0000 mL | Freq: Once | INTRAVENOUS | Status: AC
  Administered 2017-06-15: 500 mL via INTRAVENOUS

## 2017-06-15 MED ORDER — PIPERACILLIN-TAZOBACTAM 3.375 G IVPB
3.3750 g | Freq: Three times a day (TID) | INTRAVENOUS | Status: DC
  Administered 2017-06-15 – 2017-06-17 (×5): 3.375 g via INTRAVENOUS
  Filled 2017-06-15 (×7): qty 50

## 2017-06-15 MED ORDER — PIPERACILLIN-TAZOBACTAM 3.375 G IVPB
3.3750 g | Freq: Three times a day (TID) | INTRAVENOUS | Status: DC
  Filled 2017-06-15 (×2): qty 50

## 2017-06-15 MED ORDER — WARFARIN - PHARMACIST DOSING INPATIENT
Freq: Every day | Status: DC
  Administered 2017-06-16 – 2017-06-18 (×3)

## 2017-06-15 MED ORDER — SODIUM CHLORIDE 0.9% FLUSH
3.0000 mL | Freq: Two times a day (BID) | INTRAVENOUS | Status: DC
  Administered 2017-06-15 – 2017-06-19 (×3): 3 mL via INTRAVENOUS

## 2017-06-15 MED ORDER — WARFARIN SODIUM 4 MG PO TABS
4.0000 mg | ORAL_TABLET | Freq: Once | ORAL | Status: DC
  Filled 2017-06-15: qty 1

## 2017-06-15 MED ORDER — ONDANSETRON HCL 4 MG PO TABS: 4.0000 mg | ORAL_TABLET | Freq: Four times a day (QID) | ORAL | Status: DC | PRN

## 2017-06-15 MED ORDER — ALLOPURINOL 100 MG PO TABS: 200.0000 mg | ORAL_TABLET | Freq: Every day | ORAL | Status: DC

## 2017-06-15 MED ORDER — VANCOMYCIN HCL IN DEXTROSE 750-5 MG/150ML-% IV SOLN: 750.0000 mg | INTRAVENOUS | Status: DC

## 2017-06-15 MED ORDER — METOPROLOL TARTRATE 5 MG/5ML IV SOLN
2.5000 mg | Freq: Two times a day (BID) | INTRAVENOUS | Status: DC
  Administered 2017-06-15 – 2017-06-16 (×2): 2.5 mg via INTRAVENOUS
  Filled 2017-06-15 (×2): qty 5

## 2017-06-15 MED ORDER — FUROSEMIDE 10 MG/ML IJ SOLN
20.0000 mg | Freq: Every day | INTRAMUSCULAR | Status: DC
  Administered 2017-06-16: 20 mg via INTRAVENOUS
  Filled 2017-06-15: qty 4

## 2017-06-15 MED ORDER — VANCOMYCIN HCL IN DEXTROSE 1-5 GM/200ML-% IV SOLN
1000.0000 mg | Freq: Once | INTRAVENOUS | Status: DC
  Administered 2017-06-15: 1000 mg via INTRAVENOUS
  Filled 2017-06-15: qty 200

## 2017-06-15 MED ORDER — ONDANSETRON HCL 4 MG/2ML IJ SOLN
4.0000 mg | Freq: Four times a day (QID) | INTRAMUSCULAR | Status: DC | PRN
  Administered 2017-06-18: 4 mg via INTRAVENOUS
  Filled 2017-06-15: qty 2

## 2017-06-15 MED ORDER — INSULIN ASPART 100 UNIT/ML ~~LOC~~ SOLN
0.0000 [IU] | SUBCUTANEOUS | Status: DC
  Administered 2017-06-15 – 2017-06-16 (×3): 1 [IU] via SUBCUTANEOUS

## 2017-06-15 MED ORDER — DEXTROSE-NACL 5-0.9 % IV SOLN
INTRAVENOUS | Status: AC
  Administered 2017-06-15: 21:00:00 via INTRAVENOUS
  Filled 2017-06-15 (×2): qty 1000

## 2017-06-15 MED ORDER — VANCOMYCIN HCL IN DEXTROSE 750-5 MG/150ML-% IV SOLN
750.0000 mg | INTRAVENOUS | Status: DC
  Administered 2017-06-16: 750 mg via INTRAVENOUS
  Filled 2017-06-15 (×2): qty 150

## 2017-06-15 MED ORDER — ACETAMINOPHEN 325 MG PO TABS
650.0000 mg | ORAL_TABLET | Freq: Four times a day (QID) | ORAL | Status: DC | PRN
  Administered 2017-06-17: 650 mg via ORAL
  Filled 2017-06-15: qty 2

## 2017-06-15 NOTE — Progress Notes (Signed)
Pharmacy Antibiotic Note  Kara Hammond is a 83 y.o. female admitted on 06/15/2017 as a code stroke; however, she was outside of the window for tPA.  Pharmacy has been consulted for vancomycin and Zosyn dosing for sepsis.  Baseline labs reviewed.   Plan: - Vanc 1500mg  IV x 1, then 750mg  IV Q24H - Zosyn 3.375gm IV Q8H, 4 hr infusion - Monitor renal fxn, clinical progress, vanc trough as indicated - F/U with continuing AC  Height: 5' (152.4 cm) Weight: 177 lb 0.5 oz (80.3 kg) IBW/kg (Calculated) : 45.5  Temp (24hrs), Avg:100.9 F (38.3 C), Min:100.9 F (38.3 C), Max:100.9 F (38.3 C)   Recent Labs Lab 06/15/17 1145  WBC 17.3*  CREATININE 1.93*  1.70*    Estimated Creatinine Clearance: 18.5 mL/min (A) (by C-G formula based on SCr of 1.93 mg/dL (H)).    No Known Allergies  Vanc 7/14 >> Zosyn 7/14 >>  7/14 BCx -    Kara Hammond D. Mina Marble, PharmD, BCPS Pager:  [phone] 06/15/2017, 12:42 PM

## 2017-06-15 NOTE — Consult Note (Addendum)
Code stroke note  Referring Physician: Dr. Rogene Houston    Chief Complaint: slurry speech  HPI: Kara Hammond is an 83 y.o. female with CHF, PAF on Coumadin, HLD, HTN presented to ED for code stroke due to slurry speech. Patient was last seen normal last night. She did not get up from bed early this morning, however, her son saw her at 9:30 after patient woke up and found her not in her normal state. She seems more drowsy sleepy, slurring words, less responsive as before . EMS called, on arrival, EMS suspect left-sided weakness and leaning towards to the left side and then code stroke was called.  In the ED, I met with patient at bridge with stroke team, patient was drowsy and sleepy and slurry but able to move both upper extremities symmetrically, nonmoving lower extremity much bilaterally.   CT head stat no acute abnormality. Temperature in the ER was low-grade fever, creatinine 1.7 and WBC 13.3 INR was 2.13. She has bad smell of urine, and concerning for UTI. As per daughter, patient was given antibiotics recently for right lower extremity wound but not total have UTI. Her lower extremities are weak at baseline with edema, but able to walk slowly with walker at home .  LSN: Last night  tPA Given: No: Out of window, INR therapeutic   No past medical history on file, but according to notes from outside, she had a history of CHF, paroxysmal A. fib on Coumadin, HTN, HLD.  No past surgical history on file.  No family history on file. Social History:  has no tobacco, alcohol, and drug history on file.  Allergies: No Known Allergies  Medications: I have reviewed the patient's current medications.  ROS: Review of Systems: ROS was attempted today and was able to be performed.  Systems assessed include - Constitutional, Eyes, HENT, Respiratory, Cardiovascular, Gastrointestinal, Genitourinary, Integument/breast, Hematologic/lymphatic, Musculoskeletal, Neurological, Behavioral/Psych, Endocrine,  Allergic/Immunologic - the patient complains of only the following symptoms, and all other reviewed systems are negative.  Physical Examination: Blood pressure (!) 119/58, pulse 99, temperature (!) 100.9 F (38.3 C), temperature source Oral, resp. rate 19, SpO2 (!) 89 %.  General - Well nourished, well developed, lethargic and sleepy.  Ophthalmologic - Fundi not visualized due to noncooperation.  Cardiovascular - irregularly irregular heart rate and rhythm.  Neuro - lethargic, sleepy, able to arouse with repetitive stimulation, orientated to herself and age and people, but not to place or time. Spontaneous speech with short sentences, follow with only limited commands. Eyes attending to both side, blinking to visual threat bilaterally. PERRL. No significant facial asymmetry, tongue in middle, hard of hearing. BUE symmetrical 2/5 proximal and 4-/5 distal. BLE edema and swollen, 1-2/5 bilaterally, not against gravity. DTR 1+ and no Babinski. Sensation, coordination not cooperative, gait not tested   NIH Stroke Scale  Level Of Consciousness 0=Alert; keenly responsive 1=Not alert, but arousable by minor stimulation 2=Not alert, requires repeated stimulation 3=Responds only with reflex movements 1  LOC Questions to Month and Age 56=Answers both questions correctly 1=Answers one question correctly 2=Answers neither question correctly 1  LOC Commands      -Open/Close eyes     -Open/close grip 0=Performs both tasks correctly 1=Performs one task correctly 2=Performs neighter task correctly 1  Best Gaze 0=Normal 1=Partial gaze palsy 2=Forced deviation, or total gaze paresis 0  Visual 0=No visual loss 1=Partial hemianopia 2=Complete hemianopia 3=Bilateral hemianopia (blind including cortical blindness) 0  Facial Palsy 0=Normal symmetrical movement 1=Minor paralysis (asymmetry) 2=Partial paralysis (  lower face) 3=Complete paralysis (upper and lower face) 0  Motor  0=No drift, limb holds  posture for full 10 seconds 1=Drift, limb holds posture, no drift to bed 2=Some antigravity effort, cannot maintain posture, drifts to bed 3=No effort against gravity, limb falls 4=No movement Right Arm 1     Leg 1    Left Arm 3     Leg 3  Limb Ataxia 0=Absent 1=Present in one limb 2=Present in two limbs 0  Sensory 0=Normal 1=Mild to moderate sensory loss 2=Severe to total sensory loss 0  Best Language 0=No aphasia, normal 1=Mild to moderate aphasia 2=Mute, global aphasia 3=Mute, global aphasia 0  Dysarthria 0=Normal 1=Mild to moderate 2=Severe, unintelligible or mute/anarthric 1  Extinction/Neglect 0=No abnormality 1=Extinction to bilateral simultaneous stimulation 2=Profound neglect 0  Total   12      Results for orders placed or performed during the hospital encounter of 06/15/17 (from the past 48 hour(s))  CBG monitoring, ED     Status: Abnormal   Collection Time: 06/15/17 11:37 AM  Result Value Ref Range   Glucose-Capillary 139 (H) 65 - 99 mg/dL   Comment 1 Notify RN   I-stat troponin, ED     Status: Abnormal   Collection Time: 06/15/17 11:43 AM  Result Value Ref Range   Troponin i, poc 0.13 (HH) 0.00 - 0.08 ng/mL   Comment NOTIFIED PHYSICIAN    Comment 3            Comment: Due to the release kinetics of cTnI, a negative result within the first hours of the onset of symptoms does not rule out myocardial infarction with certainty. If myocardial infarction is still suspected, repeat the test at appropriate intervals.   Protime-INR     Status: Abnormal   Collection Time: 06/15/17 11:45 AM  Result Value Ref Range   Prothrombin Time 24.2 (H) 11.4 - 15.2 seconds   INR 2.13   APTT     Status: Abnormal   Collection Time: 06/15/17 11:45 AM  Result Value Ref Range   aPTT 45 (H) 24 - 36 seconds    Comment:        IF BASELINE aPTT IS ELEVATED, SUGGEST PATIENT RISK ASSESSMENT BE USED TO DETERMINE APPROPRIATE ANTICOAGULANT THERAPY.   CBC     Status: Abnormal    Collection Time: 06/15/17 11:45 AM  Result Value Ref Range   WBC 17.3 (H) 4.0 - 10.5 K/uL   RBC 3.71 (L) 3.87 - 5.11 MIL/uL   Hemoglobin 12.1 12.0 - 15.0 g/dL   HCT 36.2 36.0 - 46.0 %   MCV 97.6 78.0 - 100.0 fL   MCH 32.6 26.0 - 34.0 pg   MCHC 33.4 30.0 - 36.0 g/dL   RDW 15.2 11.5 - 15.5 %   Platelets 141 (L) 150 - 400 K/uL  Differential     Status: Abnormal   Collection Time: 06/15/17 11:45 AM  Result Value Ref Range   Neutrophils Relative % 91 %   Neutro Abs 15.7 (H) 1.7 - 7.7 K/uL   Lymphocytes Relative 4 %   Lymphs Abs 0.7 0.7 - 4.0 K/uL   Monocytes Relative 5 %   Monocytes Absolute 0.8 0.1 - 1.0 K/uL   Eosinophils Relative 0 %   Eosinophils Absolute 0.0 0.0 - 0.7 K/uL   Basophils Relative 0 %   Basophils Absolute 0.0 0.0 - 0.1 K/uL  I-Stat Chem 8, ED     Status: Abnormal   Collection Time: 06/15/17 11:45 AM  Result  Value Ref Range   Sodium 139 135 - 145 mmol/L   Potassium 3.9 3.5 - 5.1 mmol/L   Chloride 96 (L) 101 - 111 mmol/L   BUN 59 (H) 6 - 20 mg/dL   Creatinine, Ser 1.70 (H) 0.44 - 1.00 mg/dL   Glucose, Bld 137 (H) 65 - 99 mg/dL   Calcium, Ion 1.02 (L) 1.15 - 1.40 mmol/L   TCO2 30 0 - 100 mmol/L   Hemoglobin 13.3 12.0 - 15.0 g/dL   HCT 39.0 36.0 - 46.0 %   I have personally reviewed the radiological images below and agree with the radiology interpretations.  Ct Head Code Stroke W/o Cm  Result Date: 06/15/2017 CLINICAL DATA:  Code stroke. LEFT-sided weakness and facial droop. Slurred speech. EXAM: CT HEAD WITHOUT CONTRAST TECHNIQUE: Contiguous axial images were obtained from the base of the skull through the vertex without intravenous contrast. COMPARISON:  None. FINDINGS: Brain: No evidence for acute infarction, hemorrhage, hydrocephalus, or extra-axial fluid. Advanced cerebral and cerebellar atrophy. Extensive white matter disease. There is a small cluster of calcifications near the midline foramen of Magendie inferior fourth ventricle, 5 mm in diameter, reference  images 5 series 3 and image 29 series 6. While this most often simply represents choroid plexus, a small calcified subependymoma is not completely excluded. Vascular: Calcification of the cavernous internal carotid arteries consistent with cerebrovascular atherosclerotic disease. No signs of intracranial large vessel occlusion. Skull: Normal. Negative for fracture or focal lesion. Sinuses/Orbits: No acute finding.  BILATERAL cataract extraction. Other: None. ASPECTS [facility] Stroke Program Early CT Score) - Ganglionic level infarction (caudate, lentiform nuclei, internal capsule, insula, M1-M3 cortex): 7 - Supraganglionic infarction (M4-M6 cortex): 3 Total score (0-10 with 10 being normal): 10 IMPRESSION: 1. Advanced atrophy and small vessel disease. No acute intracranial findings. See discussion above regarding possible incidental choroid plexus calcification versus indolent calcified lesion at the foramen Magendie. 2. ASPECTS is 10. These results were called by telephone at the time of interpretation on 06/15/2017 at 12:01 pm to Dr. Rory Percy, who verbally acknowledged these results. Electronically Signed   By: Staci Righter M.D.   On: 06/15/2017 12:04    Assessment: 83 y.o. female with PMH of CHF, PAF on Coumadin, HLD, HTN presented to ED for code stroke due to slurry speech and lethargy. Patient was last seen normal last night. Exam non focal. CT negative for acute changes. Has bad urine smell, low grade fever and elevated WBC, concerning for UTI with encephalopathy. Recommend medicine admit for further management. Still recommend MRI brain to rule out stroke. However, non focal exam and INR therapeutic make stroke less likely at this time.   Stroke Risk Factors - atrial fibrillation, hyperlipidemia and hypertension  Plan: - recommend medicine admit for infection work up such as UA, urine culture and blood culture - recommend MRI brain to rule out stroke - if not acute infarct on MRI, no further stroke work  up needed.  - continue coumadin for stroke prevention, INR 2-3 - stroke team will follow.  Rosalin Hawking, MD PhD Stroke Neurology 06/15/2017 12:40 PM   ADDENDUM MRI showed no acute infarct. No further stroke work up needed at this time. Suspect encephalopathy due to infectious source. Treatment as per primary team. Continue coumadin with INR goal 2-3. Stroke neurology will sign off. Please call with questions.   Rosalin Hawking, MD PhD Stroke Neurology 06/15/2017 5:54 PM  This patient is critically ill due to code stroke and encephalopathy and at significant risk of neurological  worsening, death form stroke. This patient's care requires constant monitoring of vital signs, hemodynamics, respiratory and cardiac monitoring, review of multiple databases, neurological assessment, discussion with family, other specialists and medical decision making of high complexity. I spent 45 minutes of neurocritical care time in the care of this patient.

## 2017-06-15 NOTE — ED Notes (Signed)
Pharmacy made aware 1G vancomycin had already been started when 1500mg  order placed. Will send 500mg  for after 1G infusion.

## 2017-06-15 NOTE — ED Notes (Signed)
Pt was saturated in urine through her brief from home. Pt was cleaned with soap and water and placed urine collection device to suction. Pt has red/purplish skin on buttocks.

## 2017-06-15 NOTE — Progress Notes (Signed)
Patient arrived to unit.  Alert with some confusion.  No complaints of pain or discomfort.  Patient incontinent times one.  Sacrum with noted redness, stage one.  Edema bilateral lower legs.  Right lower leg drainage covered with bandage.  Family stated patient was seen approximately 2 weeks ago, injury to right lower leg during transfer from chair to wheelchair.

## 2017-06-15 NOTE — ED Notes (Signed)
Report given to crystal RN

## 2017-06-15 NOTE — H&P (Signed)
[facility] Admission History and Physical Service Pager: [phone]  Patient name: Kara Hammond Medical record number: [id number] Date of birth: 02-Dec-1926 Age: 83 y.o. Gender: female  Primary Care Provider: System, Pcp Not In Consultants: Neurology  Code Status: DNR/DNI- discussed with family on admission  Chief Complaint: AMS  Assessment and Plan: Kara Hammond is a 83 y.o. female presenting with AMS. PMH is significant for non-rheumatic mitral regurgitation, paroxysmal atrial fibrillation, chronic diastolic heart failure, CKD stage 3, HTN, Chronic anticoagulation, Pulmonary arterial hypertension, non-rheumatic tricuspid valve insufficiency, gout, osteoporosis, diabetes mellitus, and transient cerebral ischemia.   AMS Patient noted by family to be sleepier than normal and unable to talk starting this morning. Think infection is the mostly likely etiology of the AMS, given that patient met sepsis criteria on admission with leukocytosis, fever, and AMS. There is a clear source of infection with RLE cellulitis and an open wound on the right shin with purulent drainage. No concerns for other sources of infection, as patient has not recently complained of urinary symptoms, fever, chest pain, shortness of breath, or cough. UA unimpressive with only small leukocytes and CXR without focal infiltrate. Unlikely meningitis since patient was moving head without difficulty or pain. Possibly cardiac in origin due to elevated troponin of 0.13, but less likely due to lack of chest pain or other cardiac symptoms. EKG on admission is difficult to read due to frequent PVCs, but no obvious ST/T wave changes. Less likely drug related and family denies any alcohol or illicit substance use, but will obtain UDS. No new medications, so low suspicion that AMS is caused by medication side effect. Less likely endocrine related due to lack of PMHx of thyroid disorder and CBG on admission of 139.  Unlikely traumatic in origin due to no history of recent falls and CT head without acute abnormalities. Unlikely stroke due to MRI showing no focal lesions, but advanced atrophy.   -admit to telemetry under inpatient status, Dr. Ardelia Mems attending  -continue to keep NPO while patient is altered, continue gentle IVF. Failed bedside swallow eval. -continue IV vancomycin and Zosyn (day #1)  -UDS -Blood and urine cultures -trend troponins -repeat EKG now and again in the morning -Cardiac monitoring -PT/OT/SLP evaluation  -TSH, A1C, lipids -HIV, RPR, B12, folate to rule out other causes of AMS -continue to monitor CBG -neuro checks q4hrs -monitor BMP & CBC   R lower extremity cellulitis Erythema and edema of right leg. Likely cellulitis due to increased warmth on palpation, erythema, and temperature on admission. History of trauma from scratch in dentist office last week, and patient has had spreading redness since then.  -continue broad spectrum antibiotics  -plan to transition to PO antibiotics once patient shows clinical improvement -continue to monitor   Paroxysmal A-fib / Mitral valve replacement Currently on warfarin for anticoagulation. Per cardiology note goal INR 2-3.5. Current INR of 2.13, within therapeutic range. Unclear if patient has a bioprosthetic or mechanical valve. Per chart review, followed by [facility].  -Coumadin per pharmacy   Chronic Diastolic Heart Failure Currently being monitored by [facility]. CXR showing vascular congestion and left base atelectasis. Crackles auscultated up to mid lung on lung exam. Lower extremity edema with chronic skin changes noted on exam. Home lasix 40 mg tid. BNP 2371 on admission, no previous BNPs for comparison. -will order ECHO to evaluate further -plan to start Lasix at 69m IV daily starting tomorrow. Do not want to drop pressures  too low, but do not want to put patient into flash pulmonary edema. Will  see how her pressures do after one dose and can add additional doses tomorrow if she tolerates this well.  CKD III Current Cr of 1.93. Unsure of baseline. -continue to monitor -will continue gentle IVF overnight x 10 hours while NPO, then stop fluids in the morning.  Hypotension with a history of HTN Current BP of 106/51. Home coreg 3.125 mg bid.  -metoprolol 2.5 mg IV q12h while NPO. Will continue beta blocker in the setting of soft BPs due to patient's elevated troponin and do not want to precipitate a cardiac event. If pressures cannot tolerate metoprolol, will discontinue.  HLD Home pravastatin 20 mg  -lipid panel pending  -holding home statin while NPO  Diabetes Mellitus Current CBG of 139. Home glimepiride 4 mg and gabapentin 600 mg bid -sensitive sliding scale insulin  -holding home glimepiride while hospitalized and holding home gabapentin while NPO  Hx TIA -hold home pravastatin while NPO  Gout Home allopurinol 200 mg  -holding while NPO  FEN/GI: NPO as patient did not pass bedside swallow. SLP consulted for swallowing eval in the morning. Prophylaxis: coumadin   Disposition: arrived from home   History of Present Illness:  Kara Hammond is a 83 y.o. female presenting with AMS. Per family patient was at baseline yesterday and was found this morning to be altered. Patient lives with son and at baseline is oriented to person, place, and time, is able to to monitor her own medications. Patient requires assistance with some ADLs but can walk with a walker. Son saw mother yesterday at 5:30 pm and was at baseline. This morning around 9:30 am patient was found to be "not acting right" and was unable to speak. Patient was shaky and not forming words, and found to be leaning to the left. Patient has been reported to be sleepy all day. Recently patient has had increased anxiety, with yesterday being the most anxious. Patient has not recently complained of urinary symptoms, fever,  chest pain, shortness of breath, or cough.  On exam patient was very tired and could not provide detailed history. Patient had difficulty performing commands. Was oriented to self but not to place or time. Patient denied pain and chest pain. Patient recently scratched leg at dentists office and formed what was described as a red rash. On exam appeared cellulitic. Patient met sepsis criteria on admission with source of infection, leukocytosis, fever, and AMS. UA showed small leukocytes. Patient denies pain, chest pain, or abdominal discomfort. Patients family staes she has not complained of any recent urinary symptoms, fever, chest pain, SOB, or cough. Patient states she had a very dry mouth.   Review Of Systems: Per HPI with the following additions:   Review of Systems  Constitutional: Positive for malaise/fatigue. Negative for chills and fever.  HENT: Negative for congestion and sore throat.   Respiratory: Negative for cough and shortness of breath.   Cardiovascular: Positive for leg swelling. Negative for chest pain.  Gastrointestinal: Negative for constipation, diarrhea and vomiting.  Genitourinary: Negative for dysuria and urgency.  Musculoskeletal: Negative for falls.  Skin: Positive for rash.  Neurological: Negative for seizures.  Psychiatric/Behavioral: Positive for depression. The patient is nervous/anxious.     Patient Active Problem List   Diagnosis Date Noted  . Altered mental status 06/15/2017  . Cellulitis 06/15/2017    Past Medical History: Per care everywhere: Paroxysmal a-fib Chronic diastolic heart failure CKD III HTN  HLD T2DM Hx TIA Gout  Past Surgical History: Mitral valve replacement in 1998  Social History: Social History  Substance Use Topics  . Smoking status: Not on file  . Smokeless tobacco: Not on file  . Alcohol use Not on file   Additional social history: Denies tobacco, alcohol, and drug use. Lives at home with son who is POA.  Please also  refer to relevant sections of EMR.  Family History: Daughters - HTN Daughters - Diabetes Mother - MI (died age 53s) Father - Heart disease (died age 72s) Sisters - Lung cancer, tobacco abuse Brother - brain aneurysm   Allergies and Medications: No Known Allergies No current facility-administered medications on file prior to encounter.    No current outpatient prescriptions on file prior to encounter.    Objective: BP (!) 104/58 (BP Location: Left Arm)   Pulse 81   Temp 97.8 F (36.6 C) (Oral)   Resp 15   Ht 5' (1.524 m)   Wt 177 lb 0.5 oz (80.3 kg)   SpO2 95%   BMI 34.57 kg/m  Exam: General: patient was drowsy on exam. Was oriented to person but not place or time. Lying in bed  Eyes: PERRL, EOMI, no scleral icterus ENTM: dry and tacky mucous membranes Neck: Supple, normal ROM Cardiovascular: RRR, III/VI systolic murmur, no carotid bruits Respiratory: crackles auscultated up to mid lung bilaterally, no increased work of breathing  Gastrointestinal: soft, non tender, non distended, bowel sounds heard in all 4 quadrants  MSK: bilateral edema of lower extremities, right lower leg edema  Derm: R leg erythema with central superficial ulcer with purulent discharge Neuro: unable to perform full neuro exam due to patients inability to follow commands. Oriented to self. Sensation intact bilaterally        Labs and Imaging: CBC BMET   Recent Labs Lab 06/15/17 1145  WBC 17.3*  HGB 12.1  13.3  HCT 36.2  39.0  PLT 141*    Recent Labs Lab 06/15/17 1145  NA 137  139  K 4.1  3.9  CL 97*  96*  CO2 28  BUN 64*  59*  CREATININE 1.93*  1.70*  GLUCOSE 139*  137*  CALCIUM 9.6     Mr Brain Wo Contrast (neuro Protocol)  Result Date: 06/15/2017 CLINICAL DATA:  New onset generalized weakness and confusion beginning yesterday. EXAM: MRI HEAD WITHOUT CONTRAST TECHNIQUE: Multiplanar, multiecho pulse sequences of the brain and surrounding structures were obtained  without intravenous contrast. COMPARISON:  CT head without contrast 11:48 a.m. the same day. FINDINGS: Brain: The diffusion-weighted images demonstrate no acute or subacute infarction. Advanced atrophy is again noted. Relatively little white matter disease is present. Dilated perivascular spaces are noted within the basal ganglia. The brainstem and cerebellum are normal. The calcified lesion at the foramen of image NG likely reflects choroid. No definite lesion is present otherwise. The study is mildly degraded by patient motion. Ventricles are proportionate to the degree of atrophy. No significant extra-axial fluid collection is present. Next healed flow is present in the major intracranial arteries. Vascular: Flow is present in the major intracranial arteries. Skull and upper cervical spine: The craniocervical junction is distorted due to patient motion. Upper cervical spine stenosis is suspected. Midline sagittal structures are otherwise unremarkable. Sinuses/Orbits: A polyp or mucous retention cyst is present inferiorly in the left maxillary sinus. The paranasal sinuses and mastoid air cells are otherwise clear. Bilateral lens replacements are present. The globes and the orbits are otherwise normal. IMPRESSION:  1. No acute or focal lesion to explain the patient's new onset weakness or confusion. 2. Calcified lesion at the foramen of mention D likely reflects choroid. 3. Advanced generalized atrophy without significant white matter disease. 4. The degenerative changes with probable stenosis the upper cervical spine. The images are distorted by patient motion. Electronically Signed   By: San Morelle M.D.   On: 06/15/2017 17:20   Dg Chest Port 1 View  Result Date: 06/15/2017 CLINICAL DATA:  Left-sided weakness EXAM: PORTABLE CHEST 1 VIEW COMPARISON:  None. FINDINGS: Prior median sternotomy. Cardiomegaly with vascular congestion. Left basilar atelectasis. No effusion or acute bony abnormality.  IMPRESSION: Cardiomegaly with vascular congestion.  Left base atelectasis. Electronically Signed   By: Rolm Baptise M.D.   On: 06/15/2017 12:45   Ct Head Code Stroke W/o Cm  Result Date: 06/15/2017 CLINICAL DATA:  Code stroke. LEFT-sided weakness and facial droop. Slurred speech. EXAM: CT HEAD WITHOUT CONTRAST TECHNIQUE: Contiguous axial images were obtained from the base of the skull through the vertex without intravenous contrast. COMPARISON:  None. FINDINGS: Brain: No evidence for acute infarction, hemorrhage, hydrocephalus, or extra-axial fluid. Advanced cerebral and cerebellar atrophy. Extensive white matter disease. There is a small cluster of calcifications near the midline foramen of Magendie inferior fourth ventricle, 5 mm in diameter, reference images 5 series 3 and image 29 series 6. While this most often simply represents choroid plexus, a small calcified subependymoma is not completely excluded. Vascular: Calcification of the cavernous internal carotid arteries consistent with cerebrovascular atherosclerotic disease. No signs of intracranial large vessel occlusion. Skull: Normal. Negative for fracture or focal lesion. Sinuses/Orbits: No acute finding.  BILATERAL cataract extraction. Other: None. ASPECTS [facility] Stroke Program Early CT Score) - Ganglionic level infarction (caudate, lentiform nuclei, internal capsule, insula, M1-M3 cortex): 7 - Supraganglionic infarction (M4-M6 cortex): 3 Total score (0-10 with 10 being normal): 10 IMPRESSION: 1. Advanced atrophy and small vessel disease. No acute intracranial findings. See discussion above regarding possible incidental choroid plexus calcification versus indolent calcified lesion at the foramen Magendie. 2. ASPECTS is 10. These results were called by telephone at the time of interpretation on 06/15/2017 at 12:01 pm to Dr. Rory Percy, who verbally acknowledged these results. Electronically Signed   By: Staci Righter M.D.   On: 06/15/2017 12:04    Caroline More, DO 06/15/2017, 11:33 PM PGY-1, [place] Intern pager: [phone], text pages welcome  FPTS Upper-Level Resident Addendum  I have independently interviewed and examined the patient. I have discussed the above with the original author and agree with their documentation. My edits for correction/addition/clarification are in blue. Please see also any attending notes.   Hyman Bible, MD PGY-3, [place] Service pager: [phone] (text pages welcome through [place])

## 2017-06-15 NOTE — Code Documentation (Signed)
83 y.o. female w/ PMHx of DM, who was found by family this morning to have left sided weakness, left sided facial droop and dysarthria. EMS reporting onset of symptoms at 0530 today. However, in discussion with family, the son was the last to see her normal yesterday around 66. Today her son went to check on her around 1000 and found her to have the above symptoms. EMS was notified and responded. EMS called code stroke from the field. Patient arrived to [facility] ED with the stroke team waiting at the bridge. Airway was cleared, labs were drawn and patient taken to CT.   Per radiology report, CT with no acute intercranial abnormalities. ASPECTS 10. NIHSS 14. See EMR for NIHSS and code stroke times. On exam, patient with poor effort, generalized weakness and with very little usable speech. Pt moderately aphasic with near intelligible dysarthria. Pt has a strong urine odor and appears to have been incontinent. BLE edema with bandaged wound to her right shin. IV tPA not given d/t being out of the window. Not an IR candidate d/t no LVO. ED bedside handoff with ED RN Janett Billow.

## 2017-06-15 NOTE — ED Notes (Signed)
Arrived back from CT, daughter at bedside speaking to stroke team.

## 2017-06-15 NOTE — ED Notes (Signed)
Pt returned from MRI °

## 2017-06-15 NOTE — ED Triage Notes (Addendum)
Pt arrives by [facility] em[facility] from home for slurred speech and ams with right sided weakness that was noticed when she woke up this am per daughter. Pt has right lower leg ulcer that is red and swollen, pt clothes also smell of urine, pts clothes removed and placed into gown.

## 2017-06-15 NOTE — Progress Notes (Addendum)
ANTICOAGULATION CONSULT NOTE - Initial Consult  Pharmacy Consult for Coumadin Indication: atrial fibrillation  No Known Allergies  Patient Measurements: Height: 5' (152.4 cm) Weight: 177 lb 0.5 oz (80.3 kg) IBW/kg (Calculated) : 45.5 Heparin Dosing Weight:   Vital Signs: Temp: 97.6 F (36.4 C) (07/14 1858) Temp Source: Oral (07/14 1858) BP: 106/51 (07/14 1858) Pulse Rate: 84 (07/14 1858)  Labs:  Recent Labs  06/15/17 1145  HGB 12.1  13.3  HCT 36.2  39.0  PLT 141*  APTT 45*  LABPROT 24.2*  INR 2.13  CREATININE 1.93*  1.70*    Estimated Creatinine Clearance: 18.5 mL/min (A) (by C-G formula based on SCr of 1.93 mg/dL (H)).   Medical History: No past medical history on file.  Medications:  Prescriptions Prior to Admission  Medication Sig Dispense Refill Last Dose  . acarbose (PRECOSE) 50 MG tablet Take 50 mg by mouth 3 (three) times daily.  3 06/14/2017 at Unknown time  . acetaminophen (TYLENOL) 500 MG tablet Take 500-1,000 mg by mouth every 6 (six) hours as needed for mild pain or headache.   PRN  . allopurinol (ZYLOPRIM) 100 MG tablet Take 200 mg by mouth daily.  1 06/14/2017 at Unknown time  . carvedilol (COREG) 3.125 MG tablet Take 3.125 mg by mouth 2 (two) times daily.  3 06/14/2017 at 1800  . furosemide (LASIX) 40 MG tablet Take 40 mg by mouth 3 (three) times daily.   06/14/2017 at Unknown time  . gabapentin (NEURONTIN) 600 MG tablet Take 600 mg by mouth 2 (two) times daily.  1 06/14/2017 at Unknown time  . glimepiride (AMARYL) 4 MG tablet Take 4 mg by mouth every morning.   06/14/2017 at Unknown time  . KLOR-CON 10 10 MEQ tablet Take 10 mEq by mouth daily.  1 06/14/2017 at Unknown time  . Multiple Vitamin (MULTIVITAMIN) LIQD Take 1 tablet by mouth daily.   06/14/2017 at Unknown time  . pantoprazole (PROTONIX) 40 MG tablet Take 40 mg by mouth daily.  1 06/14/2017 at Unknown time  . pravastatin (PRAVACHOL) 20 MG tablet Take 20 mg by mouth daily.  3 06/14/2017 at Unknown  time  . vitamin B-12 (CYANOCOBALAMIN) 1000 MCG tablet Take 1,000 mcg by mouth daily.   06/14/2017 at Unknown time  . warfarin (COUMADIN) 2 MG tablet Take 2-4 mg by mouth See admin instructions. Take 1 tab (2mg ) po sun, mon, wed , Friday. Take 2 tabs (4 mg) Tuesday, Thursday, Saturday.  0 06/14/2017 at 1800    Assessment: 83 y.o female presents to ED 7/14 with AMS.   She takes coumadin pta for h/o PAF.  INR 2.13 on admit PTA coumadin dose: 2mg  qSunMWF and 4mg  qTTSat, last taken 06/14/17 H/H wnl, pltc 141k No bleeding noted.   Goal of Therapy:  INR 2-3    Monitor platelets by anticoagulation protocol: Yes   Plan:  Coumadin 4mg  po x1 tonight (usual home dose) Daily PT/INR   Nicole Cella, RPh Clinical Pharmacist Pager: [phone] 06/15/2017,9:41 PM

## 2017-06-15 NOTE — ED Notes (Signed)
Dr. Rogene Houston notified of elevated trop of 0.13

## 2017-06-15 NOTE — ED Provider Notes (Signed)
[facility] DEPT Provider Note   CSN: [id number] Arrival date & time: 06/15/17  1136   An emergency department physician performed an initial assessment on this suspected stroke patient at 1136.  History   Chief Complaint Chief Complaint  Patient presents with  . Code Stroke    HPI Kara Hammond is a 83 y.o. female.  Patient brought in by EMS, [facility], patient came in as a code stroke. Reported that she had left-sided the weakness since 5:30 this morning. Upon arrival patient seemed to have more of a global weakness and although global confusion. She would answer some questions she would follow commands. Patient continued on with code stroke protocol. Family states that patient has starting yesterday seemed as generalized weakness generalized confusion. Patient is from home.      No past medical history on file.  There are no active problems to display for this patient.   No past surgical history on file.  OB History    No data available       Home Medications    Prior to Admission medications   Medication Sig Start Date End Date Taking? Authorizing Provider  acarbose (PRECOSE) 50 MG tablet Take 50 mg by mouth 3 (three) times daily. 05/19/17  Yes [provider]  acetaminophen (TYLENOL) 500 MG tablet Take 500-1,000 mg by mouth every 6 (six) hours as needed for mild pain or headache.   Yes [provider]  allopurinol (ZYLOPRIM) 100 MG tablet Take 200 mg by mouth daily. 04/27/17  Yes [provider]  carvedilol (COREG) 3.125 MG tablet Take 3.125 mg by mouth 2 (two) times daily. 03/18/17  Yes [provider]  furosemide (LASIX) 40 MG tablet Take 40 mg by mouth 3 (three) times daily. 03/25/17  Yes [provider]  gabapentin (NEURONTIN) 600 MG tablet Take 600 mg by mouth 2 (two) times daily. 04/26/17  Yes [provider]  glimepiride (AMARYL) 4 MG tablet Take 4 mg by mouth every morning.   Yes [provider]  KLOR-CON 10 10 MEQ tablet Take 10 mEq by mouth daily. 05/24/17  Yes [provider]  Multiple Vitamin (MULTIVITAMIN) LIQD Take 1 tablet by mouth daily.   Yes [provider]  pantoprazole (PROTONIX) 40 MG tablet Take 40 mg by mouth daily. 04/20/17  Yes [provider]  pravastatin (PRAVACHOL) 20 MG tablet Take 20 mg by mouth daily. 04/06/17  Yes [provider]  vitamin B-12 (CYANOCOBALAMIN) 1000 MCG tablet Take 1,000 mcg by mouth daily.   Yes [provider]  warfarin (COUMADIN) 2 MG tablet Take 2-4 mg by mouth See admin instructions. Take 1 tab (73m) po sun, mon, wed , Friday. Take 2 tabs (4 mg) Tuesday, Thursday, Saturday. 06/01/17  Yes [provider]    Family History No family history on file.  Social History Social History  Substance Use Topics  . Smoking status: Not on file  . Smokeless tobacco: Not on file  . Alcohol use Not on file     Allergies   Patient has no known allergies.   Review of Systems Review of Systems  Unable to perform ROS: Mental status change     Physical Exam Updated Vital Signs BP 128/82   Pulse 85   Temp (!) 100.9 F (38.3 C) (Oral)   Resp 19   Ht 1.524 m (5')   Wt 80.3 kg (177 lb 0.5 oz)   SpO2 97%   BMI 34.57 kg/m   Physical  Exam  Constitutional: She appears well-developed and well-nourished. No distress.  HENT:  Head: Normocephalic and atraumatic.  Mouth/Throat: Oropharynx is clear and moist.  Eyes: Conjunctivae and EOM are normal.  Neck: Normal range of motion. Neck supple.  Cardiovascular: Normal rate and regular rhythm.   Pulmonary/Chest: Effort normal and breath sounds normal.  Abdominal: Soft. Bowel sounds are normal. There is no tenderness.  Musculoskeletal: Normal range of motion. She exhibits edema.  Neurological: She is alert. No cranial nerve deficit.  Generalized weakness to all extremities. Speech essentially normal.  Skin: Skin is warm.  Nursing  note and vitals reviewed.    ED Treatments / Results  Labs (all labs ordered are listed, but only abnormal results are displayed) Labs Reviewed  PROTIME-INR - Abnormal; Notable for the following:       Result Value   Prothrombin Time 24.2 (*)    All other components within normal limits  APTT - Abnormal; Notable for the following:    aPTT 45 (*)    All other components within normal limits  CBC - Abnormal; Notable for the following:    WBC 17.3 (*)    RBC 3.71 (*)    Platelets 141 (*)    All other components within normal limits  DIFFERENTIAL - Abnormal; Notable for the following:    Neutro Abs 15.7 (*)    All other components within normal limits  COMPREHENSIVE METABOLIC PANEL - Abnormal; Notable for the following:    Chloride 97 (*)    Glucose, Bld 139 (*)    BUN 64 (*)    Creatinine, Ser 1.93 (*)    GFR calc non Af Amer 22 (*)    GFR calc Af Amer 25 (*)    All other components within normal limits  URINALYSIS, ROUTINE W REFLEX MICROSCOPIC - Abnormal; Notable for the following:    APPearance HAZY (*)    Hgb urine dipstick SMALL (*)    Protein, ur 100 (*)    Leukocytes, UA SMALL (*)    Bacteria, UA FEW (*)    Squamous Epithelial / LPF 0-5 (*)    All other components within normal limits  I-STAT TROPOININ, ED - Abnormal; Notable for the following:    Troponin i, poc 0.13 (*)    All other components within normal limits  CBG MONITORING, ED - Abnormal; Notable for the following:    Glucose-Capillary 139 (*)    All other components within normal limits  I-STAT CHEM 8, ED - Abnormal; Notable for the following:    Chloride 96 (*)    BUN 59 (*)    Creatinine, Ser 1.70 (*)    Glucose, Bld 137 (*)    Calcium, Ion 1.02 (*)    All other components within normal limits  CULTURE, BLOOD (ROUTINE X 2)  CULTURE, BLOOD (ROUTINE X 2)  I-STAT CG4 LACTIC ACID, ED  I-STAT CG4 LACTIC ACID, ED    EKG  EKG Interpretation None       Radiology Dg Chest Port 1 View  Result  Date: 06/15/2017 CLINICAL DATA:  Left-sided weakness EXAM: PORTABLE CHEST 1 VIEW COMPARISON:  None. FINDINGS: Prior median sternotomy. Cardiomegaly with vascular congestion. Left basilar atelectasis. No effusion or acute bony abnormality. IMPRESSION: Cardiomegaly with vascular congestion.  Left base atelectasis. Electronically Signed   By: Rolm Baptise M.D.   On: 06/15/2017 12:45   Ct Head Code Stroke W/o Cm  Result Date: 06/15/2017 CLINICAL DATA:  Code stroke. LEFT-sided weakness and facial droop. Slurred speech.  EXAM: CT HEAD WITHOUT CONTRAST TECHNIQUE: Contiguous axial images were obtained from the base of the skull through the vertex without intravenous contrast. COMPARISON:  None. FINDINGS: Brain: No evidence for acute infarction, hemorrhage, hydrocephalus, or extra-axial fluid. Advanced cerebral and cerebellar atrophy. Extensive white matter disease. There is a small cluster of calcifications near the midline foramen of Magendie inferior fourth ventricle, 5 mm in diameter, reference images 5 series 3 and image 29 series 6. While this most often simply represents choroid plexus, a small calcified subependymoma is not completely excluded. Vascular: Calcification of the cavernous internal carotid arteries consistent with cerebrovascular atherosclerotic disease. No signs of intracranial large vessel occlusion. Skull: Normal. Negative for fracture or focal lesion. Sinuses/Orbits: No acute finding.  BILATERAL cataract extraction. Other: None. ASPECTS [facility] Stroke Program Early CT Score) - Ganglionic level infarction (caudate, lentiform nuclei, internal capsule, insula, M1-M3 cortex): 7 - Supraganglionic infarction (M4-M6 cortex): 3 Total score (0-10 with 10 being normal): 10 IMPRESSION: 1. Advanced atrophy and small vessel disease. No acute intracranial findings. See discussion above regarding possible incidental choroid plexus calcification versus indolent calcified lesion at the foramen Magendie. 2. ASPECTS  is 10. These results were called by telephone at the time of interpretation on 06/15/2017 at 12:01 pm to Dr. Rory Percy, who verbally acknowledged these results. Electronically Signed   By: Staci Righter M.D.   On: 06/15/2017 12:04    Procedures Procedures (including critical care time)  CRITICAL CARE Performed by: Fredia Sorrow Total critical care time: 30 minutes Critical care time was exclusive of separately billable procedures and treating other patients. Critical care was necessary to treat or prevent imminent or life-threatening deterioration. Critical care was time spent personally by me on the following activities: development of treatment plan with patient and/or surrogate as well as nursing, discussions with consultants, evaluation of patient's response to treatment, examination of patient, obtaining history from patient or surrogate, ordering and performing treatments and interventions, ordering and review of laboratory studies, ordering and review of radiographic studies, pulse oximetry and re-evaluation of patient's condition.   Medications Ordered in ED Medications  0.9 %  sodium chloride infusion (0 mLs Intravenous Stopped 06/15/17 1409)  vancomycin (VANCOCIN) IVPB 750 mg/150 ml premix (not administered)  piperacillin-tazobactam (ZOSYN) IVPB 3.375 g (not administered)  piperacillin-tazobactam (ZOSYN) IVPB 3.375 g (0 g Intravenous Stopped 06/15/17 1325)  sodium chloride 0.9 % bolus 500 mL (500 mLs Intravenous New Bag/Given 06/15/17 1253)  vancomycin (VANCOCIN) 500 mg in sodium chloride 0.9 % 100 mL IVPB (500 mg Intravenous New Bag/Given 06/15/17 1413)     Initial Impression / Assessment and Plan / ED Course  I have reviewed the triage vital signs and the nursing notes.  Pertinent labs & imaging results that were available during my care of the patient were reviewed by me and considered in my medical decision making (see chart for details).    Patient brought in by EMS from  [facility]. Patient arrived as a code stroke. Patient reportedly had left sided weakness that started around 5:30 this morning.  Patient was met by the stroke team. Patient was noted to have low-grade fever. Head CT was negative for any acute bleed or acute findings. Her weakness here was more global.  After discussion with family it does sound as if there've been some generalized weakness and generalized confusion.  Due to the fever and leukocytosis although there was no hypotension. There was increased respiratory rate patient was started on sepsis protocol. Patient received broad-spectrum antibiotics.  Lactic acid  was less than 2. Blood culture still pending. Chest x-ray negative No focal source for infection but Bactrim is not completely ruled out. Patient will still need MRI of the brain to evaluate things further. Urinalysis was negative for perhaps that would be the source of infection. But it was not. Patient will be admitted by family medicine. MRI has been ordered.   Final Clinical Impressions(s) / ED Diagnoses   Final diagnoses:  Altered mental status, unspecified altered mental status type  Weakness    New Prescriptions New Prescriptions   No medications on file     Fredia Sorrow, MD 06/15/17 1557

## 2017-06-15 NOTE — ED Notes (Signed)
On arrival back to ED treatment room this RN noticed that the 18G IV started by [facility] was beginning to swell around the site and leak around the IV cathter, this rn removed IV and placed sterile gauze with coban around IV site. Daughter at bedside and made aware.

## 2017-06-16 DIAGNOSIS — R4182 Altered mental status, unspecified: Secondary | ICD-10-CM

## 2017-06-16 DIAGNOSIS — L03115 Cellulitis of right lower limb: Secondary | ICD-10-CM

## 2017-06-16 LAB — BASIC METABOLIC PANEL
Anion gap: 8 (ref 5–15)
BUN: 61 mg/dL — AB (ref 6–20)
CO2: 30 mmol/L (ref 22–32)
Calcium: 8.9 mg/dL (ref 8.9–10.3)
Chloride: 102 mmol/L (ref 101–111)
Creatinine, Ser: 1.83 mg/dL — ABNORMAL HIGH (ref 0.44–1.00)
GFR calc non Af Amer: 23 mL/min — ABNORMAL LOW (ref >60)
GFR, EST AFRICAN AMERICAN: 27 mL/min — AB (ref >60)
Glucose, Bld: 120 mg/dL — ABNORMAL HIGH (ref 65–99)
POTASSIUM: 3.4 mmol/L — AB (ref 3.5–5.1)
SODIUM: 140 mmol/L (ref 135–145)

## 2017-06-16 LAB — TROPONIN I
TROPONIN I: 0.13 ng/mL — AB (ref <0.03)
TROPONIN I: 0.23 ng/mL — AB (ref <0.03)
Troponin I: 0.19 ng/mL (ref <0.03)
Troponin I: 0.09 ng/mL (ref <0.03)

## 2017-06-16 LAB — BLOOD CULTURE ID PANEL (REFLEXED)
Acinetobacter baumannii: NOT DETECTED
CANDIDA ALBICANS: NOT DETECTED
CANDIDA GLABRATA: NOT DETECTED
CANDIDA TROPICALIS: NOT DETECTED
Candida krusei: NOT DETECTED
Candida parapsilosis: NOT DETECTED
ENTEROBACTER CLOACAE COMPLEX: NOT DETECTED
ENTEROBACTERIACEAE SPECIES: NOT DETECTED
Enterococcus species: NOT DETECTED
Escherichia coli: NOT DETECTED
Haemophilus influenzae: NOT DETECTED
KLEBSIELLA PNEUMONIAE: NOT DETECTED
Klebsiella oxytoca: NOT DETECTED
LISTERIA MONOCYTOGENES: NOT DETECTED
Methicillin resistance: DETECTED — AB
NEISSERIA MENINGITIDIS: NOT DETECTED
Proteus species: NOT DETECTED
Pseudomonas aeruginosa: NOT DETECTED
STREPTOCOCCUS AGALACTIAE: NOT DETECTED
STREPTOCOCCUS PYOGENES: NOT DETECTED
Serratia marcescens: NOT DETECTED
Staphylococcus aureus (BCID): NOT DETECTED
Staphylococcus species: DETECTED — AB
Streptococcus pneumoniae: NOT DETECTED
Streptococcus species: NOT DETECTED

## 2017-06-16 LAB — GLUCOSE, CAPILLARY
GLUCOSE-CAPILLARY: 120 mg/dL — AB (ref 65–99)
GLUCOSE-CAPILLARY: 130 mg/dL — AB (ref 65–99)
Glucose-Capillary: 108 mg/dL — ABNORMAL HIGH (ref 65–99)
Glucose-Capillary: 121 mg/dL — ABNORMAL HIGH (ref 65–99)
Glucose-Capillary: 133 mg/dL — ABNORMAL HIGH (ref 65–99)
Glucose-Capillary: 192 mg/dL — ABNORMAL HIGH (ref 65–99)
Glucose-Capillary: 45 mg/dL — ABNORMAL LOW (ref 65–99)
Glucose-Capillary: 83 mg/dL (ref 65–99)

## 2017-06-16 LAB — CBC
HCT: 31.2 % — ABNORMAL LOW (ref 36.0–46.0)
Hemoglobin: 10.4 g/dL — ABNORMAL LOW (ref 12.0–15.0)
MCH: 32.7 pg (ref 26.0–34.0)
MCHC: 33.3 g/dL (ref 30.0–36.0)
MCV: 98.1 fL (ref 78.0–100.0)
PLATELETS: 126 10*3/uL — AB (ref 150–400)
RBC: 3.18 MIL/uL — AB (ref 3.87–5.11)
RDW: 15.7 % — AB (ref 11.5–15.5)
WBC: 19 10*3/uL — AB (ref 4.0–10.5)

## 2017-06-16 LAB — RAPID URINE DRUG SCREEN, HOSP PERFORMED
Amphetamines: NOT DETECTED
BENZODIAZEPINES: NOT DETECTED
Barbiturates: NOT DETECTED
COCAINE: NOT DETECTED
OPIATES: NOT DETECTED
Tetrahydrocannabinol: NOT DETECTED

## 2017-06-16 LAB — PROTIME-INR
INR: 2.27
PROTHROMBIN TIME: 25.4 s — AB (ref 11.4–15.2)

## 2017-06-16 LAB — VITAMIN B12: Vitamin B-12: 381 pg/mL (ref 180–914)

## 2017-06-16 MED ORDER — PRAVASTATIN SODIUM 20 MG PO TABS
20.0000 mg | ORAL_TABLET | Freq: Every day | ORAL | Status: DC
  Administered 2017-06-17 – 2017-06-19 (×3): 20 mg via ORAL
  Filled 2017-06-16 (×3): qty 1

## 2017-06-16 MED ORDER — INSULIN ASPART 100 UNIT/ML ~~LOC~~ SOLN
0.0000 [IU] | Freq: Three times a day (TID) | SUBCUTANEOUS | Status: DC
  Administered 2017-06-16: 2 [IU] via SUBCUTANEOUS
  Administered 2017-06-17: 1 [IU] via SUBCUTANEOUS
  Administered 2017-06-17: 3 [IU] via SUBCUTANEOUS
  Administered 2017-06-18 (×2): 2 [IU] via SUBCUTANEOUS
  Administered 2017-06-18: 1 [IU] via SUBCUTANEOUS
  Administered 2017-06-18: 2 [IU] via SUBCUTANEOUS
  Administered 2017-06-19: 3 [IU] via SUBCUTANEOUS
  Administered 2017-06-19: 1 [IU] via SUBCUTANEOUS

## 2017-06-16 MED ORDER — WARFARIN SODIUM 2 MG PO TABS
4.0000 mg | ORAL_TABLET | Freq: Once | ORAL | Status: AC
  Administered 2017-06-16: 4 mg via ORAL
  Filled 2017-06-16 (×2): qty 2

## 2017-06-16 MED ORDER — PANTOPRAZOLE SODIUM 40 MG PO TBEC
40.0000 mg | DELAYED_RELEASE_TABLET | Freq: Every day | ORAL | Status: DC
  Administered 2017-06-17 – 2017-06-19 (×3): 40 mg via ORAL
  Filled 2017-06-16 (×3): qty 1

## 2017-06-16 MED ORDER — WARFARIN SODIUM 2 MG PO TABS: 2.0000 mg | ORAL_TABLET | ORAL | Status: DC

## 2017-06-16 MED ORDER — CARVEDILOL 3.125 MG PO TABS
3.1250 mg | ORAL_TABLET | Freq: Two times a day (BID) | ORAL | Status: DC
  Administered 2017-06-16 – 2017-06-18 (×4): 3.125 mg via ORAL
  Filled 2017-06-16 (×6): qty 1

## 2017-06-16 MED ORDER — FUROSEMIDE 40 MG PO TABS
40.0000 mg | ORAL_TABLET | Freq: Three times a day (TID) | ORAL | Status: DC
  Administered 2017-06-16 – 2017-06-18 (×6): 40 mg via ORAL
  Filled 2017-06-16 (×6): qty 1

## 2017-06-16 NOTE — Evaluation (Signed)
Physical Therapy Evaluation Patient Details Name: Kara Hammond MRN: [id number] DOB: Oct 24, 1926 Today's Date: 06/16/2017   History of Present Illness  83 y.o. female presenting with AMS. PMH is significant for non-rheumatic mitral regurgitation, paroxysmal atrial fibrillation, chronic diastolic heart failure, CKD stage 3, HTN, Chronic anticoagulation, Pulmonary arterial hypertension, non-rheumatic tricuspid valve insufficiency, gout, osteoporosis, diabetes mellitus, and transient cerebral ischemia  Clinical Impression  Orders received for PT evaluation. Patient demonstrates deficits in functional mobility as indicated below. Will benefit from continued skilled PT to address deficits and maximize function. Will see as indicated and progress as tolerated.  At this time, patient requires physical assist for all aspects of mobility, daughter present at bedside and agrees that patient would benefit from [facility] SNF upon acute discharge to maximize recovery of baseline function.     Follow Up Recommendations SNF;Supervision/Assistance - 24 hour    Equipment Recommendations  None recommended by PT    Recommendations for Other Services       Precautions / Restrictions Precautions Precautions: Fall Restrictions Weight Bearing Restrictions: No      Mobility  Bed Mobility Overal bed mobility: Needs Assistance Bed Mobility: Supine to Sit;Sit to Supine     Supine to sit: Max assist;+2 for physical assistance;HOB elevated Sit to supine: Max assist;+2 for physical assistance;HOB elevated   General bed mobility comments: Patient shows some evidence of trunk engagement and LE movement, 2 person max assist to elevate trunk and rotate to EOB, assist to return to supine. (OF NOTE: patient sleeps in lift chair recliner at home)  Transfers Overall transfer level: Needs assistance Equipment used: Rolling walker (2 wheeled) Transfers: Sit to/from Stand Sit to Stand: Mod assist         General  transfer comment: Moderate assist for safety and power up to standing with manual assist to place RUE on RW and cue to task  Ambulation/Gait Ambulation/Gait assistance: Min assist Ambulation Distance (Feet): 4 Feet Assistive device: Rolling walker (2 wheeled) Gait Pattern/deviations: Step-to pattern;Shuffle;Trunk flexed Gait velocity: decreased   General Gait Details: patient able to take minimal shuffling steps with RW, unable to maintain.  Stairs            Wheelchair Mobility    Modified Rankin (Stroke Patients Only)       Balance Overall balance assessment: Needs assistance;History of Falls   Sitting balance-Leahy Scale: Fair Sitting balance - Comments: able to sit EOB min guad, some posterior lean noted during dynamic movement   Standing balance support: Bilateral upper extremity supported Standing balance-Leahy Scale: Poor Standing balance comment: reliance on UE support and physical assist                             Pertinent Vitals/Pain Pain Assessment: Faces Faces Pain Scale: Hurts even more Pain Location: BLE more on R side Pain Descriptors / Indicators: Discomfort;Grimacing;Sharp Pain Intervention(s): Monitored during session;Limited activity within patient's tolerance    Home Living Family/patient expects to be discharged to:: Private residence Living Arrangements: Children (son) Available Help at Discharge: Family;Available 24 hours/day Type of Home: House Home Access: Stairs to enter;Other (comment) (Building a ramp)   Entrance Stairs-Number of Steps: 2   Home Equipment: [place] - 2 wheels;Bedside commode;Wheelchair - manual Additional Comments: Daughter "Webb Silversmith" present during session to provide home set up and PLOF    Prior Function Level of Independence: Needs assistance   Gait / Transfers Assistance Needed: RW and w/c; always has someone supervising  during mobility  ADL's / Homemaking Assistance Needed: Daughter assists with  sponge bathing and dressing        Hand Dominance   Dominant Hand: Right    Extremity/Trunk Assessment        Lower Extremity Assessment Lower Extremity Assessment: Generalized weakness;RLE deficits/detail;LLE deficits/detail RLE Deficits / Details: extreme pitting edema noted bilateral LEs, gross strength in RLE 1+/5 with poor ability to mobilize upon assessment but in function standing able to support weight RLE: Unable to fully assess due to pain RLE Sensation: history of peripheral neuropathy;decreased light touch RLE Coordination: decreased fine motor;decreased gross motor LLE Deficits / Details: extreme pitting edema noted BLEs, gross strength 3/5  LLE Sensation: decreased light touch LLE Coordination: decreased fine motor;decreased gross motor    Cervical / Trunk Assessment Cervical / Trunk Assessment:  (increased body habitus)  Communication   Communication: HOH  Cognition Arousal/Alertness: Awake/alert Behavior During Therapy: WFL for tasks assessed/performed Overall Cognitive Status: Difficult to assess Area of Impairment: Attention;Following commands;Safety/judgement;Awareness;Problem solving                   Current Attention Level: Sustained   Following Commands: Follows one step commands with increased time Safety/Judgement: Decreased awareness of safety;Decreased awareness of deficits Awareness: Emergent Problem Solving: Slow processing;Difficulty sequencing;Requires verbal cues;Requires tactile cues General Comments: max multi modal cues during session, question baseline cognition level      General Comments      Exercises     Assessment/Plan    PT Assessment Patient needs continued PT services  PT Problem List Decreased strength;Decreased activity tolerance;Decreased balance;Decreased mobility;Decreased coordination;Decreased cognition;Decreased knowledge of use of DME;Decreased safety awareness;Obesity;Pain       PT Treatment  Interventions DME instruction;Gait training;Functional mobility training;Therapeutic activities;Therapeutic exercise;Balance training;Cognitive remediation;Patient/family education    PT Goals (Current goals can be found in the Care Plan section)  Acute Rehab PT Goals Patient Stated Goal: to get stronger and go home PT Goal Formulation: With patient/family Time For Goal Achievement: 06/09/17 Potential to Achieve Goals: Good    Frequency Min 2X/week   Barriers to discharge        Co-evaluation PT/OT/SLP Co-Evaluation/Treatment: Yes Reason for Co-Treatment: Necessary to address cognition/behavior during functional activity;For patient/therapist safety PT goals addressed during session: Mobility/safety with mobility OT goals addressed during session: ADL's and self-care       AM-PAC PT "6 Clicks" Daily Activity  Outcome Measure Difficulty turning over in bed (including adjusting bedclothes, sheets and blankets)?: Total Difficulty moving from lying on back to sitting on the side of the bed? : Total Difficulty sitting down on and standing up from a chair with arms (e.g., wheelchair, bedside commode, etc,.)?: Total Help needed moving to and from a bed to chair (including a wheelchair)?: A Lot Help needed walking in hospital room?: A Lot Help needed climbing 3-5 steps with a railing? : A Lot 6 Click Score: 9    End of Session Equipment Utilized During Treatment: Gait belt Activity Tolerance: Patient limited by fatigue Patient left: in bed;with call bell/phone within reach;with bed alarm set;with family/visitor present Nurse Communication: Mobility status PT Visit Diagnosis: Unsteadiness on feet (R26.81);Other abnormalities of gait and mobility (R26.89);Muscle weakness (generalized) (M62.81);Difficulty in walking, not elsewhere classified (R26.2)    Time: 7829-5621 PT Time Calculation (min) (ACUTE ONLY): 31 min   Charges:   PT Evaluation $PT Eval Moderate Complexity: 1  Procedure     PT G Codes:        Alben Deeds, PT DPT  [facility] [place] 06/16/2017, 9:39 AM

## 2017-06-16 NOTE — Progress Notes (Signed)
Patient had elevated troponin notified attending they ordered a 12 lead EKG it was abnormal, strip was posted to patients CHL results for attending to view.

## 2017-06-16 NOTE — Clinical Social Work Note (Signed)
Clinical Social Work Assessment  Patient Details  Name: Kara Hammond MRN: [id number] Date of Birth: 1925-12-07  Date of referral:  06/16/17               Reason for consult:  Facility Placement, Discharge Planning                Permission sought to share information with:  Family Supports Permission granted to share information::  Yes, Verbal Permission Granted  Name::     son Inocente Salles, daughters Jana Half and Radiation protection practitioner::     Relationship::     Contact Information:     Housing/Transportation Living arrangements for the past 2 months:  Single Family Home (with son) Source of Information:  Adult Children, Medical Team Patient Interpreter Needed:  None Criminal Activity/Legal Involvement Pertinent to Current Situation/Hospitalization:  No - Comment as needed Significant Relationships:  Adult Children Lives with:  Adult Children Do you feel safe going back to the place where you live?  Yes Need for family participation in patient care:  Yes (Comment) (children involved in care decisions, pt not oriented to situation)  Care giving concerns:  Pt from home where she resides with her son Inocente Salles. Inocente Salles states at baseline pt ambulates with a walker and with a wheelchair for longer distances. Eats independently, minimal assistance with toileting. Children note that "she doesn't get up on her own, she's had falls in the past so now she always asks for help" transferring.  Has been to rehab x2 in the past with good results following those falls per children. Children note pt at baseline is more oriented than at present. Note continued medical work up to address AMS.    Social Worker assessment / plan:  CSW consulted for potential SNF placement. Met with pt, son, and daughter, at bedside. Pt interacting with when CSW entered. Can state name and location but disoriented to Time/Place. Children note this is not typical for pt. Discussed current therapy recommendation for skilled nursing placement for rehab.  Family states pt has done well at rehab in the past [facility] and [facility]) and are open to SNF referrals. Completed FL2 and referred to [facility] facilities at family's request. Attempted to obtain PASSR but pt's name/DOB did not match in system. Left voicemail for family to see if DOB/SS does not match system. Passr will need to be contacted during business hours to remedy this.  Plan: SNF at DC. Will follow up with bed offers.  Employment status:  Retired Nurse, adult PT Recommendations:  [place], [place] / Referral to community resources:  [id number]  Patient/Family's Response to care:  Family appreciative of care received. Pt participated very minimally and unable to gauge response to care  Patient/Family's Understanding of and Emotional Response to Diagnosis, Current Treatment, and Prognosis:  Family demonstrates adequate understanding of pt's status and of potential DC plan. Are familiar with process therefore asked pertinent questions re: referrals. Are hopeful that pt's mobility will progress during hospital stay and not warrant SNF, however they are very reasonable and understanding that SNF may be best option.   Emotional Assessment Appearance:  Appears stated age Attitude/Demeanor/Rapport:   (drowsy, calm) Affect (typically observed):   (unremarkable. Pt interacting with staff but mumbling) Orientation:  Oriented to Self, Oriented to Place Alcohol / Substance use:  Not Applicable Psych involvement (Current and /or in the community):  No (Comment)  Discharge Needs  Concerns to be  addressed:  Discharge Planning Concerns Readmission within the last 30 days:  No Current discharge risk:  Dependent with Mobility Barriers to Discharge:  Continued Medical Work up   [employer], LCSW 06/16/2017, 2:53 PM  Weekend coverage [phone]

## 2017-06-16 NOTE — Progress Notes (Signed)
ANTICOAGULATION CONSULT NOTE - Follow up   Pharmacy Consult for Coumadin Indication: atrial fibrillation/ valve replacement   No Known Allergies  Patient Measurements: Height: 5' (152.4 cm) Weight: 174 lb 6.1 oz (79.1 kg) IBW/kg (Calculated) : 45.5  Vital Signs: Temp: 99.7 F (37.6 C) (07/15 1013) Temp Source: Oral (07/15 1013) BP: 133/51 (07/15 1013) Pulse Rate: 74 (07/15 1013)  Labs:  Recent Labs  06/15/17 1145 06/15/17 2052 06/16/17 0216 06/16/17 0811  HGB 12.1  13.3  --  10.4*  --   HCT 36.2  39.0  --  31.2*  --   PLT 141*  --  126*  --   APTT 45*  --   --   --   LABPROT 24.2*  --   --  25.4*  INR 2.13  --   --  2.27  CREATININE 1.93*  1.70*  --  1.83*  --   TROPONINI  --  0.18* 0.13* 0.19*    Estimated Creatinine Clearance: 19.4 mL/min (A) (by C-G formula based on SCr of 1.83 mg/dL (H)).   Medical History: No past medical history on file.  Medications:  Prescriptions Prior to Admission  Medication Sig Dispense Refill Last Dose  . acarbose (PRECOSE) 50 MG tablet Take 50 mg by mouth 3 (three) times daily.  3 06/14/2017 at Unknown time  . acetaminophen (TYLENOL) 500 MG tablet Take 500-1,000 mg by mouth every 6 (six) hours as needed for mild pain or headache.   PRN  . allopurinol (ZYLOPRIM) 100 MG tablet Take 200 mg by mouth daily.  1 06/14/2017 at Unknown time  . carvedilol (COREG) 3.125 MG tablet Take 3.125 mg by mouth 2 (two) times daily.  3 06/14/2017 at 1800  . furosemide (LASIX) 40 MG tablet Take 40 mg by mouth 3 (three) times daily.   06/14/2017 at Unknown time  . gabapentin (NEURONTIN) 600 MG tablet Take 600 mg by mouth 2 (two) times daily.  1 06/14/2017 at Unknown time  . glimepiride (AMARYL) 4 MG tablet Take 4 mg by mouth every morning.   06/14/2017 at Unknown time  . KLOR-CON 10 10 MEQ tablet Take 10 mEq by mouth daily.  1 06/14/2017 at Unknown time  . Multiple Vitamin (MULTIVITAMIN) LIQD Take 1 tablet by mouth daily.   06/14/2017 at Unknown time  .  pantoprazole (PROTONIX) 40 MG tablet Take 40 mg by mouth daily.  1 06/14/2017 at Unknown time  . pravastatin (PRAVACHOL) 20 MG tablet Take 20 mg by mouth daily.  3 06/14/2017 at Unknown time  . vitamin B-12 (CYANOCOBALAMIN) 1000 MCG tablet Take 1,000 mcg by mouth daily.   06/14/2017 at Unknown time  . warfarin (COUMADIN) 2 MG tablet Take 2-4 mg by mouth See admin instructions. Take 1 tab (2mg ) po sun, mon, wed , Friday. Take 2 tabs (4 mg) Tuesday, Thursday, Saturday.  0 06/14/2017 at 1800    Assessment: 83 y.o female presented to ED 7/14 with AMS.   She takes warfarin at home for h/o PAF/ valve replacement. PTA warfarin dose: 2mg  qSunMWF and 4mg  qTTSat INR today 2.27 (within target range) Missed dose on 7/14 d/t being NPO H/H wnl, pltc 126k No bleeding noted.   Goal of Therapy:  INR 2-3.5  (per outpt cardiology note) Monitor platelets by anticoagulation protocol: Yes   Plan:  Warfarin 4 mg po x1 tonight (patient missed Saturday evening dose d/t being NPO) Daily PT/INR Monitor s/sx of bleeding    Jalene Mullet, Pharm.D. PGY1 Pharmacy Resident 06/16/2017 11:13  AM Main Pharmacy: [phone] Pager: [phone]

## 2017-06-16 NOTE — NC FL2 (Signed)
[place] MEDICAID FL2 LEVEL OF CARE SCREENING TOOL     IDENTIFICATION  Patient Name: Kara Hammond Birthdate: [birth year] Sex: female Admission Date (Current Location): 06/15/2017  [name] Number:  Herbalist and Address:  The . [facility], [place] [address] [place], [place] [address]      Provider Number: [id number]  Attending Physician Name and Address:  Leeanne Rio, MD  Relative Name and Phone Number:       Current Level of Care: Hospital Recommended Level of Care: [place] Prior Approval Number:    Date Approved/Denied:   PASRR Number:    Discharge Plan: SNF    Current Diagnoses: Patient Active Problem List   Diagnosis Date Noted  . Altered mental status 06/15/2017  . Cellulitis 06/15/2017    Orientation RESPIRATION BLADDER Height & Weight     Self, Place    Incontinent Weight: 174 lb 6.1 oz (79.1 kg) Height:  5' (152.4 cm)  BEHAVIORAL SYMPTOMS/MOOD NEUROLOGICAL BOWEL NUTRITION STATUS      Continent Diet (carb modified, fluid consistency thin)  AMBULATORY STATUS COMMUNICATION OF NEEDS Skin   Limited Assist Verbally Other (Comment) (cellulitis right leg)                       Personal Care Assistance Level of Assistance  Bathing, Feeding, Dressing Bathing Assistance: Limited assistance Feeding assistance: Independent Dressing Assistance: Limited assistance     Functional Limitations Info  Sight, Hearing, Speech Sight Info: Adequate Hearing Info: Adequate Speech Info: Adequate    SPECIAL CARE FACTORS FREQUENCY  PT (By licensed PT), OT (By licensed OT), Speech therapy     PT Frequency: 5x OT Frequency: 5x     Speech Therapy Frequency: 1x      Contractures Contractures Info: Not present    Additional Factors Info  Code Status, Allergies Code Status Info: full Allergies Info: nka           Current Medications (06/16/2017):  This is the current hospital active  medication list Current Facility-Administered Medications  Medication Dose Route Frequency Provider Last Rate Last Dose  . acetaminophen (TYLENOL) tablet 650 mg  650 mg Oral Q6H PRN Mayo, Pete Pelt, MD       Or  . acetaminophen (TYLENOL) suppository 650 mg  650 mg Rectal Q6H PRN Mayo, Pete Pelt, MD      . carvedilol (COREG) tablet 3.125 mg  3.125 mg Oral BID Guadalupe Dawn, MD      . furosemide (LASIX) tablet 40 mg  40 mg Oral TID Guadalupe Dawn, MD      . insulin aspart (novoLOG) injection 0-9 Units  0-9 Units Subcutaneous TID AC & HS Guadalupe Dawn, MD      . ondansetron [facility]) tablet 4 mg  4 mg Oral Q6H PRN Mayo, Pete Pelt, MD       Or  . ondansetron [facility]) injection 4 mg  4 mg Intravenous Q6H PRN Mayo, Pete Pelt, MD      . pantoprazole (PROTONIX) EC tablet 40 mg  40 mg Oral Daily Guadalupe Dawn, MD      . piperacillin-tazobactam (ZOSYN) IVPB 3.375 g  3.375 g Intravenous Q8H Leeanne Rio, MD   Stopped at 06/16/17 [phone]  . pravastatin (PRAVACHOL) tablet 20 mg  20 mg Oral Daily Guadalupe Dawn, MD      . sodium chloride flush (NS) 0.9 % injection 3 mL  3 mL Intravenous Q12H Mayo, Pete Pelt,  MD   3 mL at 06/15/17 2107  . vancomycin (VANCOCIN) IVPB 750 mg/150 ml premix  750 mg Intravenous Q24H Leeanne Rio, MD      . warfarin (COUMADIN) tablet 4 mg  4 mg Oral ONCE-1800 Jalene Mullet, RPH      . Warfarin - Pharmacist Dosing Inpatient   Does not apply q1800 Leeanne Rio, MD         Discharge Medications: Please see discharge summary for a list of discharge medications.  Relevant Imaging Results:  Relevant Lab Results:   Additional Information SS#[id number]  Nila Nephew, LCSW

## 2017-06-16 NOTE — Progress Notes (Signed)
PHARMACY - PHYSICIAN COMMUNICATION CRITICAL VALUE ALERT - BLOOD CULTURE IDENTIFICATION (BCID)  Results for orders placed or performed during the hospital encounter of 06/15/17  Blood Culture ID Panel (Reflexed) (Collected: 06/15/2017 12:44 PM)  Result Value Ref Range   Enterococcus species NOT DETECTED NOT DETECTED   Listeria monocytogenes NOT DETECTED NOT DETECTED   Staphylococcus species DETECTED (A) NOT DETECTED   Staphylococcus aureus NOT DETECTED NOT DETECTED   Methicillin resistance PENDING NOT DETECTED   Streptococcus species NOT DETECTED NOT DETECTED   Streptococcus agalactiae NOT DETECTED NOT DETECTED   Streptococcus pneumoniae NOT DETECTED NOT DETECTED   Streptococcus pyogenes NOT DETECTED NOT DETECTED   Acinetobacter baumannii NOT DETECTED NOT DETECTED   Enterobacteriaceae species NOT DETECTED NOT DETECTED   Enterobacter cloacae complex NOT DETECTED NOT DETECTED   Escherichia coli NOT DETECTED NOT DETECTED   Klebsiella oxytoca NOT DETECTED NOT DETECTED   Klebsiella pneumoniae NOT DETECTED NOT DETECTED   Proteus species NOT DETECTED NOT DETECTED   Serratia marcescens NOT DETECTED NOT DETECTED   Haemophilus influenzae NOT DETECTED NOT DETECTED   Neisseria meningitidis NOT DETECTED NOT DETECTED   Pseudomonas aeruginosa NOT DETECTED NOT DETECTED   Candida albicans NOT DETECTED NOT DETECTED   Candida glabrata NOT DETECTED NOT DETECTED   Candida krusei NOT DETECTED NOT DETECTED   Candida parapsilosis NOT DETECTED NOT DETECTED   Candida tropicalis NOT DETECTED NOT DETECTED   Patient grew methicillin resistant staphylococcus species in 1 bottle.   Name of physician (or Provider) ContactedKris Mouton  Changes to prescribed antibiotics required: Patient on Vancomycin which will cover  Susa Raring, PharmD PGY2 Infectious Diseases Pharmacy Resident Pager: [phone]  06/16/2017  2:29 PM

## 2017-06-16 NOTE — Evaluation (Signed)
Occupational Therapy Evaluation Patient Details Name: Kara Hammond MRN: [id number] DOB: 10-May-1926 Today's Date: 06/16/2017    History of Present Illness 83 y.o. female presenting with AMS. PMH is significant for non-rheumatic mitral regurgitation, paroxysmal atrial fibrillation, chronic diastolic heart failure, CKD stage 3, HTN, Chronic anticoagulation, Pulmonary arterial hypertension, non-rheumatic tricuspid valve insufficiency, gout, osteoporosis, diabetes mellitus, and transient cerebral ischemia   Clinical Impression   PTA, pt was living with her son and performing ADLs and functional mobility with assistance from her son and daughter to increase safety. Currently, pt required Mod A for UB ADLs, Max A for LB ADLs, and Min A +2 for functional mobility with RW. Pt would benefit from acute OT to facilitate safe dc and increase pt occupational performance. Recommend pt dc to SNF for further OT to optimize safety and independence with ADLs and functional mobility as well as decreased caregiver burden.     Follow Up Recommendations  SNF;Supervision/Assistance - 24 hour    Equipment Recommendations  Other (comment) (Defer to next venue)    Recommendations for Other Services PT consult     Precautions / Restrictions Precautions Precautions: Fall Restrictions Weight Bearing Restrictions: No      Mobility Bed Mobility Overal bed mobility: Needs Assistance Bed Mobility: Supine to Sit;Sit to Supine     Supine to sit: Max assist;+2 for physical assistance;HOB elevated Sit to supine: Max assist;+2 for physical assistance;HOB elevated   General bed mobility comments: Patient shows some evidence of trunk engagement and LE movement, 2 person max assist to elevate trunk and rotate to EOB, assist to return to supine. (OF NOTE: patient sleeps in lift chair recliner at home)  Transfers Overall transfer level: Needs assistance Equipment used: Rolling walker (2 wheeled) Transfers: Sit  to/from Stand Sit to Stand: Mod assist         General transfer comment: Moderate assist for safety and power up to standing with manual assist to place RUE on RW and cue to task    Balance Overall balance assessment: Needs assistance;History of Falls Sitting-balance support: No upper extremity supported;Feet supported Sitting balance-Leahy Scale: Fair Sitting balance - Comments: able to sit EOB min guad, some posterior lean noted during dynamic movement   Standing balance support: Bilateral upper extremity supported Standing balance-Leahy Scale: Poor Standing balance comment: reliance on UE support and physical assist                           ADL either performed or assessed with clinical judgement   ADL Overall ADL's : Needs assistance/impaired Eating/Feeding: Sitting;Supervision/ safety;Set up   Grooming: Sitting;Min guard   Upper Body Bathing: Moderate assistance;Sitting   Lower Body Bathing: Maximal assistance;Sit to/from stand   Upper Body Dressing : Moderate assistance;Sitting   Lower Body Dressing: Maximal assistance;Sit to/from stand Lower Body Dressing Details (indicate cue type and reason): Max A for LB dressing to don socks. Pt able to lift feet to assist in donning socks. Toilet Transfer: Moderate assistance (simulated at EOB)           Functional mobility during ADLs: Minimal assistance;+2 for safety/equipment;Cueing for safety;Rolling walker (Max cues to safety) General ADL Comments: Pt demonstrating decreased fucntional performance and requiring Max A for LB ADLs. Pt fatigues quickly and with decreased activity tolerance     Vision Baseline Vision/History: Wears glasses Wears Glasses: At all times Patient Visual Report: Other (comment);No change from baseline (Pt without glasses in room.)  Perception     Praxis      Pertinent Vitals/Pain Pain Assessment: Faces Faces Pain Scale: Hurts even more Pain Location: BLE more on R  side Pain Descriptors / Indicators: Discomfort;Grimacing;Sharp Pain Intervention(s): Monitored during session;Limited activity within patient's tolerance     Hand Dominance Right   Extremity/Trunk Assessment Upper Extremity Assessment Upper Extremity Assessment: Generalized weakness;RUE deficits/detail;LUE deficits/detail RUE Deficits / Details: Pt with previous shoulder injury and arthritis. Limited gross motor AROM and generalized weakness RUE: Unable to fully assess due to pain RUE Coordination: decreased fine motor;decreased gross motor LUE Deficits / Details: Decreased AROM and strength in shoulder, elbow, and hands with arthritis LUE: Unable to fully assess due to pain LUE Coordination: decreased fine motor;decreased gross motor   Lower Extremity Assessment Lower Extremity Assessment: Generalized weakness RLE Deficits / Details: extreme pitting edema noted bilateral LEs, gross strength in RLE 1+/5 with poor ability to mobilize upon assessment but in function standing able to support weight RLE: Unable to fully assess due to pain RLE Sensation: history of peripheral neuropathy;decreased light touch RLE Coordination: decreased fine motor;decreased gross motor LLE Deficits / Details: extreme pitting edema noted BLEs, gross strength 3/5  LLE Sensation: decreased light touch LLE Coordination: decreased fine motor;decreased gross motor   Cervical / Trunk Assessment Cervical / Trunk Assessment:  (increased body habitus)   Communication Communication Communication: HOH   Cognition Arousal/Alertness: Awake/alert Behavior During Therapy: WFL for tasks assessed/performed Overall Cognitive Status: Difficult to assess Area of Impairment: Attention;Following commands;Safety/judgement;Awareness;Problem solving                   Current Attention Level: Sustained   Following Commands: Follows one step commands with increased time Safety/Judgement: Decreased awareness of  safety;Decreased awareness of deficits Awareness: Emergent Problem Solving: Slow processing;Difficulty sequencing;Requires verbal cues;Requires tactile cues General Comments: max multi modal cues during session, question baseline cognition level   General Comments  Daughter, "Webb Silversmith", present throughout session    Exercises     [place] expects to be discharged to:: Private residence Living Arrangements: Children (son) Available Help at Discharge: Family;Available 24 hours/day Type of Home: House Home Access: Stairs to enter;Other (comment) (Building a ramp) Entrance Stairs-Number of Steps: 2         Bathroom Shower/Tub: Teacher, early years/pre: Standard (with BSC over toilet)     Home Equipment: Walker - 2 wheels;Bedside commode;Wheelchair - manual   Additional Comments: Daughter "Webb Silversmith" present during session to provide home set up and PLOF. Sleeps in recliner.      Prior Functioning/Environment Level of Independence: Needs assistance  Gait / Transfers Assistance Needed: RW and w/c; always has someone supervising during mobility ADL's / Homemaking Assistance Needed: Daughter assists with sponge bathing and dressing            OT Problem List: Decreased strength;Decreased range of motion;Decreased activity tolerance;Impaired balance (sitting and/or standing);Decreased safety awareness;Decreased knowledge of use of DME or AE;Pain      OT Treatment/Interventions: Self-care/ADL training;Therapeutic exercise;Energy conservation;DME and/or AE instruction;Therapeutic activities;Patient/family education    OT Goals(Current goals can be found in the care plan section) Acute Rehab OT Goals Patient Stated Goal: to get stronger and go home OT Goal Formulation: With patient Time For Goal Achievement: 06/30/17 Potential to Achieve Goals: Good ADL Goals Pt Will Perform Grooming: with set-up;with supervision;sitting Pt Will  Perform Upper Body Dressing: with min assist;sitting Pt Will Perform Lower Body Dressing: with mod  assist;sit to/from stand;with adaptive equipment Pt Will Transfer to Toilet: with mod assist;bedside commode;stand pivot transfer Pt Will Perform Toileting - Clothing Manipulation and hygiene: with mod assist;sit to/from stand  OT Frequency: Min 2X/week   Barriers to D/C:            Co-evaluation PT/OT/SLP Co-Evaluation/Treatment: Yes Reason for Co-Treatment: Necessary to address cognition/behavior during functional activity PT goals addressed during session: Mobility/safety with mobility OT goals addressed during session: ADL's and self-care      AM-PAC PT "6 Clicks" Daily Activity     Outcome Measure Help from another person eating meals?: A Little Help from another person taking care of personal grooming?: A Little Help from another person toileting, which includes using toliet, bedpan, or urinal?: A Lot Help from another person bathing (including washing, rinsing, drying)?: A Lot Help from another person to put on and taking off regular upper body clothing?: A Little Help from another person to put on and taking off regular lower body clothing?: A Lot 6 Click Score: 15   End of Session Equipment Utilized During Treatment: Gait belt;Rolling walker Nurse Communication: Mobility status  Activity Tolerance: Patient limited by fatigue;Patient limited by pain Patient left: in bed;with call bell/phone within reach;with bed alarm set;with family/visitor present  OT Visit Diagnosis: Unsteadiness on feet (R26.81);Other abnormalities of gait and mobility (R26.89);Muscle weakness (generalized) (M62.81);Pain Pain - Right/Left: Right Pain - part of body: Leg                Time: 5956-3875 OT Time Calculation (min): 26 min Charges:  OT General Charges $OT Visit: 1 Procedure OT Evaluation $OT Eval Moderate Complexity: 1 Procedure G-Codes:     [place], OTR/L Acute  Rehab Pager: [phone] Office: [place] 06/16/2017, 9:58 AM

## 2017-06-16 NOTE — Progress Notes (Signed)
Family Medicine Teaching Service Daily Progress Note Intern Pager: [phone]  Patient name: Kara Hammond Medical record number: [id number] Date of birth: 1926/04/13 Age: 83 y.o. Gender: female  Primary Care Provider: System, Pcp Not In Consultants: Neurology Code Status: DNR/DNI  Pt Overview and Major Events to Date:  Kara Hammond is an 83 y.o. female presenting with AMS. Per reports from family patient was seen normally at her baseline in the afternoon of 7/14. Her baseline is AOx3, able to do most ADLs. She was checked on around noon on 7/14 and was found slumped over in a chair and was very difficult to arouse. Was mostly unresponsive. Brought to ed for further workup. Upon presentation to the ed a code stroke was fired. A CT and MRI both with no findings to explain new onset AMS. UA and cxr with no findings to explain AMS. Upon exam found to have RLE with increasing erythema and swelling. This apparently first started when her leg was hit with a part of a wheelchair at a dentist's office last week.  PMH is significant for non-rheumatic mitral regurgitation, paroxysmal atrial fibrillation, chronic diastolic heart failure, CKD stage 3, HTN, Chronic anticoagulation, Pulmonary arterial hypertension, non-rheumatic tricuspid valve insufficiency, gout, osteoporosis, diabetes mellitus, and transient cerebral ischemia.   Assessment and Plan: AMS Patient noted by family to be sleepier than normal and unable to talk starting 7/14. Given history of increasing erythema and swelling s/p trauma of RLE, elevated white count (19) and fever on presentation it is likely that RLE cellulitis is most likely cause of altered mental status. Stroke is another possible explanation but imaging (ct/mri) was negative. There were no drug changes in recent past to explain decreased alertness. Was started on vanc/zosyn in ed. WBC at 19, but has been afebrile. Patient alert on am of 7/15, AOx2 (self and place). Per family  reports RLE swelling and erythema have decreased dramatically from 7/14. UA negative, cxr with cardio/pulm congestion but no focal infiltrates. Patient with no pain in neck so meningitis unlikely. Cardiac is another possible etiology, trop of .18->.13->.19. EKG with many PVC so difficult to interpret. BNP ~2400. UDS negative. Lipid panel normal. Will consider transition to PO abx given great improvement in mental status and RLE swelling. - speech eval this am to see if appropriate for po intake - if passes can start diet and allow hydration by mouth - Continue vanc/zosyn for now, possible switch to oral abx later today - will follow blood cultures x2 - Will follow up am ekg - F/U TSH, A1c - F/U other, less typical causes of infection - Continue neuro checks q 4 hours  R lower extremity cellulitis Erythema and edema of right leg. Likely cellulitis due to increased warmth on palpation, erythema, and temperature on admission. History of trauma from scratch in dentist office last week, and patient has had spreading redness since then. Has decreased in size and erythema since 7/14. Area of demarcation greatly decreased this am. Can likely transition to po antibiotics pending swallow study. -continue vanc/zosyn for now, possibly transition to po later today -continue to mark -continue to monitor   Paroxysmal A-fib / Mitral valve replacement Currently on warfarin for anticoagulation. Per cardiology note goal INR 2-3.5. Current INR of 2.13, within therapeutic range. Unclear if patient has a bioprosthetic or mechanical valve. Per chart review, followed by [facility].  -Coumadin per pharmacy   Chronic Diastolic Heart Failure Currently being monitored by [facility]. CXR showing vascular  congestion and left base atelectasis. Decreased crackles today. Lower extremity edema with chronic skin changes noted on exam. Home lasix 40 mg tid. BNP 2371 on admission, no previous BNPs  for comparison. SBP persistently above 130s. - echo ordered for evaluation, will f/u results - will give 20mg  of IV lasix for diuresis - Can likely stop giving IV fluids if passes swallow study  CKD III Current Cr of 1.83. Improved from 1.93 on admission. Unsure of baseline. -continue to monitor - Fluids stopped, can likely start po this morning  Hypotension with a history of HTN Current BP of 133/51. Home coreg 3.125 mg bid. Will continue beta blocker due to elevated troponin. - Can likely restart home coreg if passes swallow study - continue metoprolol 2.5mg  IV q 12 while NPO  HLD Home pravastatin 20 mg  -lipid panel normal at [place] -will restart if tolerating po  Diabetes Mellitus Current CBG of 139. Home glimepiride 4 mg and gabapentin 600 mg bid - continue sensitive sliding scale insulin  -holding home glimepiride while hospitalized and holding home gabapentin while NPO  Hx TIA -hold home pravastatin while NPO, will restart when tolerating po  Gout Home allopurinol 200 mg  -holding while NPO, will restart when tolerating po  FEN/GI: npo. Swallow eval this am, if passes can start po PPx: warfarin  Disposition: likely to rehab vs snf  Subjective:  Patient doing well this morning. Had just worked with pt. PT recommending snf, which the daughter is in agreement with. Patient AOx2 (struggled to remember time). No complaints this am. RLE is less painful, less swollen. Still NPO, pending speech eval.  Objective: Temp:  [97.6 F (36.4 C)-100.9 F (38.3 C)] 98 F (36.7 C) (07/15 0603) Pulse Rate:  [77-99] 84 (07/15 0603) Resp:  [14-24] 14 (07/15 0603) BP: (104-168)/(47-106) 124/65 (07/15 0603) SpO2:  [89 %-100 %] 95 % (07/14 2100) Weight:  [174 lb 6.1 oz (79.1 kg)-177 lb 0.5 oz (80.3 kg)] 174 lb 6.1 oz (79.1 kg) (07/15 0603) Physical Exam: General: alert, oriented x3, no acute distress, no complaints Cardiovascular: RRR, Systolic ejection murmur likely grade  II. No carotid bruits noted. Palpable pulses bilateral PT/DP, Palpable Radial pulse bilaterally. Respiratory: Crackles present in bilateral lung bases, no increased work of breathing Abdomen: soft, non-tender, non-distended Extremities: able to move all four extremities, Decreased erythema and swelling RLE as compared to 7/14. No purulent drainage noted. 5/5 strength BUE, 5/5 Strength BLE. Neuro: no neuro deficits, CN2-12 intact, 5/5 strength all extremities GU: purewick catheter in place Psych: appropriate  Laboratory:  Recent Labs Lab 06/15/17 1145 06/16/17 0216  WBC 17.3* 19.0*  HGB 12.1  13.3 10.4*  HCT 36.2  39.0 31.2*  PLT 141* 126*    Recent Labs Lab 06/15/17 1145 06/16/17 0216  NA 137  139 140  K 4.1  3.9 3.4*  CL 97*  96* 102  CO2 28 30  BUN 64*  59* 61*  CREATININE 1.93*  1.70* 1.83*  CALCIUM 9.6 8.9  PROT 7.3  --   BILITOT 0.8  --   ALKPHOS 62  --   ALT 14  --   AST 28  --   GLUCOSE 139*  137* 120*    Imaging/Diagnostic Tests: CLINICAL DATA:  New onset generalized weakness and confusion beginning yesterday.  EXAM: MRI HEAD WITHOUT CONTRAST  TECHNIQUE: Multiplanar, multiecho pulse sequences of the brain and surrounding structures were obtained without intravenous contrast.  COMPARISON:  CT head without contrast 11:48 a.m. the same day.  FINDINGS: Brain: The diffusion-weighted images demonstrate no acute or subacute infarction. Advanced atrophy is again noted. Relatively little white matter disease is present. Dilated perivascular spaces are noted within the basal ganglia. The brainstem and cerebellum are normal. The calcified lesion at the foramen of image NG likely reflects choroid. No definite lesion is present otherwise. The study is mildly degraded by patient motion. Ventricles are proportionate to the degree of atrophy. No significant extra-axial fluid collection is present. Next healed flow is present in the major intracranial  arteries.  Vascular: Flow is present in the major intracranial arteries.  Skull and upper cervical spine: The craniocervical junction is distorted due to patient motion. Upper cervical spine stenosis is suspected. Midline sagittal structures are otherwise unremarkable.  Sinuses/Orbits: A polyp or mucous retention cyst is present inferiorly in the left maxillary sinus. The paranasal sinuses and mastoid air cells are otherwise clear. Bilateral lens replacements are present. The globes and the orbits are otherwise normal.  IMPRESSION: 1. No acute or focal lesion to explain the patient's new onset weakness or confusion. 2. Calcified lesion at the foramen of mention D likely reflects choroid. 3. Advanced generalized atrophy without significant white matter disease. 4. The degenerative changes with probable stenosis the upper cervical spine. The images are distorted by patient motion.   Electronically Signed   By: San Morelle M.D.   On: 06/15/2017 17:20  CLINICAL DATA:  Code stroke. LEFT-sided weakness and facial droop. Slurred speech.  EXAM: CT HEAD WITHOUT CONTRAST  TECHNIQUE: Contiguous axial images were obtained from the base of the skull through the vertex without intravenous contrast.  COMPARISON:  None.  FINDINGS: Brain: No evidence for acute infarction, hemorrhage, hydrocephalus, or extra-axial fluid. Advanced cerebral and cerebellar atrophy. Extensive white matter disease.  There is a small cluster of calcifications near the midline foramen of [place] inferior fourth ventricle, 5 mm in diameter, reference images 5 series 3 and image 29 series 6. While this most often simply represents choroid plexus, a small calcified subependymoma is not completely excluded.  Vascular: Calcification of the cavernous internal carotid arteries consistent with cerebrovascular atherosclerotic disease. No signs of intracranial large vessel  occlusion.  Skull: Normal. Negative for fracture or focal lesion.  Sinuses/Orbits: No acute finding.  BILATERAL cataract extraction.  Other: None.  ASPECTS [facility] Stroke Program Early CT Score)  - Ganglionic level infarction (caudate, lentiform nuclei, internal capsule, insula, M1-M3 cortex): 7  - Supraganglionic infarction (M4-M6 cortex): 3  Total score (0-10 with 10 being normal): 10  IMPRESSION: 1. Advanced atrophy and small vessel disease. No acute intracranial findings. See discussion above regarding possible incidental choroid plexus calcification versus indolent calcified lesion at the foramen Magendie. 2. ASPECTS is 10. These results were called by telephone at the time of interpretation on 06/15/2017 at 12:01 pm to Dr. Rory Percy, who verbally acknowledged these results.   Guadalupe Dawn, MD 06/16/2017, 10:04 AM PGY-1, [place] Intern pager: [phone], text pages welcome

## 2017-06-16 NOTE — Evaluation (Signed)
Clinical/Bedside Swallow Evaluation Patient Details  Name: Kara Hammond MRN: [id number] Date of Birth: 08-27-1926  Today's Date: 06/16/2017 Time: SLP Start Time (ACUTE ONLY): 1031 SLP Stop Time (ACUTE ONLY): 1048 SLP Time Calculation (min) (ACUTE ONLY): 17 min  Past Medical History: No past medical history on file. Past Surgical History: No past surgical history on file. HPI:  Cardiomegaly with vascular congestion.  Left base atelectasis.   Assessment / Plan / Recommendation Clinical Impression  Oral manipulation, mastication and transit within functional limits. Eructation x 1 without cough, throat clear or wet vocal quality. Pt's daughter reports history of esophageal dilation however pt denies recent globus sensation. Recommend regular texture, thin liquids, straws alllowed and pills with liquid. Will check on pt minimum of one time to ensure safety and efficiency with recommendations.  SLP Visit Diagnosis: Dysphagia, unspecified (R13.10)    Aspiration Risk  Mild aspiration risk    Diet Recommendation Regular;Thin liquid   Liquid Administration via: Cup;Straw Medication Administration: Whole meds with liquid Supervision: Patient able to self feed;Intermittent supervision to cue for compensatory strategies Compensations: Slow rate;Small sips/bites Postural Changes: Seated upright at 90 degrees;Remain upright for at least 30 minutes after po intake    Other  Recommendations Oral Care Recommendations: Oral care BID   Follow up Recommendations None      Frequency and Duration min 1 x/week  1 week       Prognosis Prognosis for Safe Diet Advancement: Good      Swallow Study   General HPI: Cardiomegaly with vascular congestion.  Left base atelectasis. Type of Study: Bedside Swallow Evaluation Previous Swallow Assessment:  (none) Diet Prior to this Study: NPO Temperature Spikes Noted: Yes Respiratory Status: Room air History of Recent Intubation: No Behavior/Cognition:  Alert;Cooperative;Pleasant mood (HOH) Oral Cavity Assessment: Dry Oral Care Completed by SLP: Yes Oral Cavity - Dentition: Adequate natural dentition Vision: Functional for self-feeding Self-Feeding Abilities: Able to feed self with adaptive devices (provided built up handle for utensils) Patient Positioning: Upright in bed Baseline Vocal Quality: Normal Volitional Cough: Strong Volitional Swallow: Able to elicit    Oral/Motor/Sensory Function Overall Oral Motor/Sensory Function: Within functional limits   Ice Chips Ice chips: Not tested   Thin Liquid Thin Liquid: Within functional limits Presentation: Cup;Straw    Nectar Thick Nectar Thick Liquid: Not tested   Honey Thick Honey Thick Liquid: Not tested   Puree Puree: Within functional limits   Solid   GO   Solid: Within functional limits        Houston Siren 06/16/2017,11:01 AM  Orbie Pyo Colvin Caroli.Ed [employer] [phone]

## 2017-06-17 ENCOUNTER — Inpatient Hospital Stay (HOSPITAL_COMMUNITY): Payer: Medicare Other

## 2017-06-17 DIAGNOSIS — R4182 Altered mental status, unspecified: Secondary | ICD-10-CM

## 2017-06-17 LAB — CBC
HCT: 28.2 % — ABNORMAL LOW (ref 36.0–46.0)
HEMOGLOBIN: 9.3 g/dL — AB (ref 12.0–15.0)
MCH: 32.4 pg (ref 26.0–34.0)
MCHC: 33 g/dL (ref 30.0–36.0)
MCV: 98.3 fL (ref 78.0–100.0)
Platelets: 104 10*3/uL — ABNORMAL LOW (ref 150–400)
RBC: 2.87 MIL/uL — AB (ref 3.87–5.11)
RDW: 15.5 % (ref 11.5–15.5)
WBC: 16.6 10*3/uL — AB (ref 4.0–10.5)

## 2017-06-17 LAB — GLUCOSE, CAPILLARY
GLUCOSE-CAPILLARY: 103 mg/dL — AB (ref 65–99)
GLUCOSE-CAPILLARY: 127 mg/dL — AB (ref 65–99)
Glucose-Capillary: 225 mg/dL — ABNORMAL HIGH (ref 65–99)
Glucose-Capillary: 124 mg/dL — ABNORMAL HIGH (ref 65–99)

## 2017-06-17 LAB — BASIC METABOLIC PANEL
ANION GAP: 9 (ref 5–15)
BUN: 69 mg/dL — ABNORMAL HIGH (ref 6–20)
CHLORIDE: 98 mmol/L — AB (ref 101–111)
CO2: 30 mmol/L (ref 22–32)
CREATININE: 2 mg/dL — AB (ref 0.44–1.00)
Calcium: 8.6 mg/dL — ABNORMAL LOW (ref 8.9–10.3)
GFR calc non Af Amer: 21 mL/min — ABNORMAL LOW (ref >60)
GFR, EST AFRICAN AMERICAN: 24 mL/min — AB (ref >60)
Glucose, Bld: 109 mg/dL — ABNORMAL HIGH (ref 65–99)
POTASSIUM: 3.3 mmol/L — AB (ref 3.5–5.1)
SODIUM: 137 mmol/L (ref 135–145)

## 2017-06-17 LAB — PROTIME-INR
INR: 2.09
PROTHROMBIN TIME: 23.8 s — AB (ref 11.4–15.2)

## 2017-06-17 LAB — ECHOCARDIOGRAM COMPLETE
Height: 60 in
Weight: 2779.56 oz

## 2017-06-17 LAB — HIV ANTIBODY (ROUTINE TESTING W REFLEX): HIV Screen 4th Generation wRfx: NONREACTIVE

## 2017-06-17 MED ORDER — DOXYCYCLINE HYCLATE 100 MG PO TABS
100.0000 mg | ORAL_TABLET | Freq: Two times a day (BID) | ORAL | Status: DC
  Administered 2017-06-17 – 2017-06-19 (×5): 100 mg via ORAL
  Filled 2017-06-17 (×5): qty 1

## 2017-06-17 MED ORDER — WARFARIN SODIUM 2 MG PO TABS
2.0000 mg | ORAL_TABLET | Freq: Once | ORAL | Status: AC
  Administered 2017-06-17: 2 mg via ORAL
  Filled 2017-06-17: qty 1

## 2017-06-17 MED ORDER — GABAPENTIN 600 MG PO TABS
600.0000 mg | ORAL_TABLET | Freq: Two times a day (BID) | ORAL | Status: DC
  Administered 2017-06-18 – 2017-06-19 (×3): 600 mg via ORAL
  Filled 2017-06-17 (×4): qty 1

## 2017-06-17 NOTE — Progress Notes (Signed)
ANTICOAGULATION CONSULT NOTE  Pharmacy Consult for warfarin Indication: atrial fibrillation  Patient Measurements: Height: 5' (152.4 cm) Weight: 173 lb 11.6 oz (78.8 kg) IBW/kg (Calculated) : 45.5  Vital Signs: Temp: 98.1 F (36.7 C) (07/16 0503) Temp Source: Oral (07/16 0503) BP: 109/51 (07/16 0503) Pulse Rate: 64 (07/16 0503)  Labs:  Recent Labs  06/15/17 1145  06/16/17 0216 06/16/17 0811 06/16/17 1306 06/16/17 1946 06/17/17 0329  HGB 12.1  13.3  --  10.4*  --   --   --  9.3*  HCT 36.2  39.0  --  31.2*  --   --   --  28.2*  PLT 141*  --  126*  --   --   --  104*  APTT 45*  --   --   --   --   --   --   LABPROT 24.2*  --   --  25.4*  --   --  23.8*  INR 2.13  --   --  2.27  --   --  2.09  CREATININE 1.93*  1.70*  --  1.83*  --   --   --  2.00*  TROPONINI  --   < > 0.13* 0.19* 0.23* 0.09*  --   < > = values in this interval not displayed.  Estimated Creatinine Clearance: 17.7 mL/min (A) (by C-G formula based on SCr of 2 mg/dL (H)).   Medical History: No past medical history on file.  Medications:  Prescriptions Prior to Admission  Medication Sig Dispense Refill Last Dose  . acarbose (PRECOSE) 50 MG tablet Take 50 mg by mouth 3 (three) times daily.  3 06/14/2017 at Unknown time  . acetaminophen (TYLENOL) 500 MG tablet Take 500-1,000 mg by mouth every 6 (six) hours as needed for mild pain or headache.   PRN  . allopurinol (ZYLOPRIM) 100 MG tablet Take 200 mg by mouth daily.  1 06/14/2017 at Unknown time  . carvedilol (COREG) 3.125 MG tablet Take 3.125 mg by mouth 2 (two) times daily.  3 06/14/2017 at 1800  . furosemide (LASIX) 40 MG tablet Take 40 mg by mouth 3 (three) times daily.   06/14/2017 at Unknown time  . gabapentin (NEURONTIN) 600 MG tablet Take 600 mg by mouth 2 (two) times daily.  1 06/14/2017 at Unknown time  . glimepiride (AMARYL) 4 MG tablet Take 4 mg by mouth every morning.   06/14/2017 at Unknown time  . KLOR-CON 10 10 MEQ tablet Take 10 mEq by mouth  daily.  1 06/14/2017 at Unknown time  . Multiple Vitamin (MULTIVITAMIN) LIQD Take 1 tablet by mouth daily.   06/14/2017 at Unknown time  . pantoprazole (PROTONIX) 40 MG tablet Take 40 mg by mouth daily.  1 06/14/2017 at Unknown time  . pravastatin (PRAVACHOL) 20 MG tablet Take 20 mg by mouth daily.  3 06/14/2017 at Unknown time  . vitamin B-12 (CYANOCOBALAMIN) 1000 MCG tablet Take 1,000 mcg by mouth daily.   06/14/2017 at Unknown time  . warfarin (COUMADIN) 2 MG tablet Take 2-4 mg by mouth See admin instructions. Take 1 tab (2mg ) po sun, mon, wed , Friday. Take 2 tabs (4 mg) Tuesday, Thursday, Saturday.  0 06/14/2017 at 1800    Assessment: 83 y.o female presented to ED 7/14 with AMS.   She takes warfarin at home for h/o PAF/ valve replacement. Current INR is 2, INR may decline further due to mising a dose on 7/14.  PTA warfarin dose: 2mg  qSunMWF and  4mg  qTTSat  Goal of Therapy:  INR 2-3.5  (per outpt cardiology note) Monitor platelets by anticoagulation protocol: Yes    Plan:  -Warfarin 2 mg po x1 -Daily INR   Hughes Better, PharmD, BCPS Clinical Pharmacist 06/17/2017 8:47 AM

## 2017-06-17 NOTE — Progress Notes (Signed)
Family Medicine Teaching Service Daily Progress Note Intern Pager: [phone]  Patient name: Kara Hammond Medical record number: [id number] Date of birth: 1926/04/24 Age: 83 y.o. Gender: female  Primary Care Provider: System, Pcp Not In Consultants: Neurology Code Status: DNR/DNI  Pt Overview and Major Events to Date:  Kara Hammond is an 83 y.o. female presenting with AMS. Per reports from family patient was seen normally at her baseline in the afternoon of 7/14. Her baseline is AOx3, able to do most ADLs. She was checked on around noon on 7/14 and was found slumped over in a chair and was very difficult to arouse. Was mostly unresponsive. Brought to ed for further workup. Upon presentation to the ed a code stroke was fired. A CT and MRI both with no findings to explain new onset AMS. UA and cxr with no findings to explain AMS. Upon exam found to have RLE with increasing erythema and swelling. This apparently first started when her leg was hit with a part of a wheelchair at a dentist's office last week.  PMH is significant for non-rheumatic mitral regurgitation, paroxysmal atrial fibrillation, chronic diastolic heart failure, CKD stage 3, HTN, Chronic anticoagulation, Pulmonary arterial hypertension, non-rheumatic tricuspid valve insufficiency, gout, osteoporosis, diabetes mellitus, and transient cerebral ischemia.   Assessment and Plan: AMS Patient noted by family to be sleepier than normal and unable to talk starting 7/14. Given history of increasing erythema and swelling s/p trauma of RLE, elevated white count (19) and fever on presentation it is likely that RLE cellulitis is most likely cause of altered mental status. Stroke is another possible explanation but imaging (ct/mri) was negative. There were no drug changes in recent past to explain decreased alertness. Was started on vanc/zosyn in ed. WBC at 19, but has been afebrile. Patient alert on am of 7/16, AOx3 (self and place). Per family  reports RLE swelling and erythema have decreased dramatically from 7/14. UA negative, cxr with cardio/pulm congestion but no focal infiltrates. Patient with no pain in neck so meningitis unlikely. Cardiac is another possible etiology, trop of .18->.13->.19->.23->.09. EKG with incomplete LBBB BNP ~2400. UDS negative. Lipid panel normal. 1 blood cx with methicillin resistant coag negative staph. - Passed speech eval, restarted diet on 7/15 - Continue vanc/zosyn for now - will continue to follow blood cultures x2 for susceptibilities - F/U TSH, A1c - Continue neuro checks q 4 hours  R lower extremity cellulitis Erythema and edema of right leg. Likely cellulitis due to increased warmth on palpation, erythema, and temperature on admission. History of trauma from scratch in dentist office last week, and patient has had spreading redness since then. Continuing to decrease in size and erythema since 7/14. Area of demarcation greatly decreased this am.  -continue vanc/zosyn for now -continue to mark -continue to monitor   Paroxysmal A-fib / Mitral valve replacement Currently on warfarin for anticoagulation. Per cardiology note goal INR 2-3.5. Current INR of 2.08, within therapeutic range. Unclear if patient has a bioprosthetic or mechanical valve. Per chart review, followed by [facility].  -Coumadin per pharmacy   Chronic Diastolic Heart Failure Currently being monitored by [facility]. CXR showing vascular congestion and left base atelectasis. Decreased crackles today. Lower extremity edema with chronic skin changes noted on exam. Home lasix 40 mg tid. BNP 2371 on admission, no previous BNPs for comparison. Well managed with lasix dose during admission. - echo ordered for evaluation, will f/u results - continue home lasix 40mg  tid  CKD  III Current Cr of 2.00. Stable from 1.93 on admission. Unsure of baseline. -continue to monitor   Hypotension with a history of  HTN Current BP of 133/51. Home coreg 3.125 mg bid. Will continue beta blocker due to elevated troponin. - continue home coreg if passes swallow study - continue metoprolol 2.5mg  IV q 12 while NPO  HLD Home pravastatin 20 mg  -lipid panel normal at admssion  Diabetes Mellitus Current CBG of 139. Home glimepiride 4 mg and gabapentin 600 mg bid - continue sensitive sliding scale insulin  -holding home glimepiride while hospitalized and holding home gabapentin while NPO  Hx TIA -hold home pravastatin while NPO, will restart when tolerating po  Gout Home allopurinol 200 mg  -holding while NPO, will restart when tolerating po  FEN/GI: heart healthy diet PPx: warfarin  Disposition: likely to rehab vs snf  Subjective:  Patient doing well this morning. No complaints. AOx4.  Objective: Temp:  [97.7 F (36.5 C)-99.7 F (37.6 C)] 97.7 F (36.5 C) (07/16 0942) Pulse Rate:  [60-106] 69 (07/16 0942) Resp:  [16-20] 20 (07/16 0942) BP: (109-133)/(44-70) 116/44 (07/16 0942) SpO2:  [91 %-98 %] 91 % (07/16 0942) Weight:  [173 lb 11.6 oz (78.8 kg)] 173 lb 11.6 oz (78.8 kg) (07/16 0503) Physical Exam: General: alert, oriented x3, no acute distress, no complaints Cardiovascular: RRR, Systolic ejection murmur likely grade II. No carotid bruits noted. Palpable pulses bilateral PT/DP, Palpable Radial pulse bilaterally. Respiratory: Crackles present in bilateral lung bases, no increased work of breathing Abdomen: soft, non-tender, non-distended Extremities: able to move all four extremities, Decreased erythema and swelling RLE as compared to 7/14. No purulent drainage noted. 5/5 strength BUE, 5/5 Strength BLE. Neuro: no neuro deficits, CN2-12 intact, 5/5 strength all extremities GU: purewick catheter in place Psych: appropriate  Laboratory:  Recent Labs Lab 06/15/17 1145 06/16/17 0216 06/17/17 0329  WBC 17.3* 19.0* 16.6*  HGB 12.1  13.3 10.4* 9.3*  HCT 36.2  39.0 31.2* 28.2*   PLT 141* 126* 104*    Recent Labs Lab 06/15/17 1145 06/16/17 0216 06/17/17 0329  NA 137  139 140 137  K 4.1  3.9 3.4* 3.3*  CL 97*  96* 102 98*  CO2 28 30 30   BUN 64*  59* 61* 69*  CREATININE 1.93*  1.70* 1.83* 2.00*  CALCIUM 9.6 8.9 8.6*  PROT 7.3  --   --   BILITOT 0.8  --   --   ALKPHOS 62  --   --   ALT 14  --   --   AST 28  --   --   GLUCOSE 139*  137* 120* 109*    Imaging/Diagnostic Tests: CLINICAL DATA:  New onset generalized weakness and confusion beginning yesterday.  EXAM: MRI HEAD WITHOUT CONTRAST  TECHNIQUE: Multiplanar, multiecho pulse sequences of the brain and surrounding structures were obtained without intravenous contrast.  COMPARISON:  CT head without contrast 11:48 a.m. the same day.  FINDINGS: Brain: The diffusion-weighted images demonstrate no acute or subacute infarction. Advanced atrophy is again noted. Relatively little white matter disease is present. Dilated perivascular spaces are noted within the basal ganglia. The brainstem and cerebellum are normal. The calcified lesion at the foramen of image NG likely reflects choroid. No definite lesion is present otherwise. The study is mildly degraded by patient motion. Ventricles are proportionate to the degree of atrophy. No significant extra-axial fluid collection is present. Next healed flow is present in the major intracranial arteries.  Vascular: Flow is present  in the major intracranial arteries.  Skull and upper cervical spine: The craniocervical junction is distorted due to patient motion. Upper cervical spine stenosis is suspected. Midline sagittal structures are otherwise unremarkable.  Sinuses/Orbits: A polyp or mucous retention cyst is present inferiorly in the left maxillary sinus. The paranasal sinuses and mastoid air cells are otherwise clear. Bilateral lens replacements are present. The globes and the orbits are otherwise normal.  IMPRESSION: 1. No acute  or focal lesion to explain the patient's new onset weakness or confusion. 2. Calcified lesion at the foramen of mention D likely reflects choroid. 3. Advanced generalized atrophy without significant white matter disease. 4. The degenerative changes with probable stenosis the upper cervical spine. The images are distorted by patient motion.   Electronically Signed   By: San Morelle M.D.   On: 06/15/2017 17:20  CLINICAL DATA:  Code stroke. LEFT-sided weakness and facial droop. Slurred speech.  EXAM: CT HEAD WITHOUT CONTRAST  TECHNIQUE: Contiguous axial images were obtained from the base of the skull through the vertex without intravenous contrast.  COMPARISON:  None.  FINDINGS: Brain: No evidence for acute infarction, hemorrhage, hydrocephalus, or extra-axial fluid. Advanced cerebral and cerebellar atrophy. Extensive white matter disease.  There is a small cluster of calcifications near the midline foramen of [place] inferior fourth ventricle, 5 mm in diameter, reference images 5 series 3 and image 29 series 6. While this most often simply represents choroid plexus, a small calcified subependymoma is not completely excluded.  Vascular: Calcification of the cavernous internal carotid arteries consistent with cerebrovascular atherosclerotic disease. No signs of intracranial large vessel occlusion.  Skull: Normal. Negative for fracture or focal lesion.  Sinuses/Orbits: No acute finding.  BILATERAL cataract extraction.  Other: None.  ASPECTS [facility] Stroke Program Early CT Score)  - Ganglionic level infarction (caudate, lentiform nuclei, internal capsule, insula, M1-M3 cortex): 7  - Supraganglionic infarction (M4-M6 cortex): 3  Total score (0-10 with 10 being normal): 10  IMPRESSION: 1. Advanced atrophy and small vessel disease. No acute intracranial findings. See discussion above regarding possible incidental choroid plexus  calcification versus indolent calcified lesion at the foramen Magendie. 2. ASPECTS is 10. These results were called by telephone at the time of interpretation on 06/15/2017 at 12:01 pm to Dr. Rory Percy, who verbally acknowledged these results.   Guadalupe Dawn, MD 06/17/2017, 9:51 AM PGY-1, [place] Intern pager: [phone], text pages welcome

## 2017-06-17 NOTE — Progress Notes (Signed)
CSW following to facilitate discharge planning. CSW contacted by [facility] to discuss referral and discharge, questioned about IV medications and when the patient would be medically ready. CSW contacted MD, who explained that the patient is still not medically ready for discharge as work-up is still in progress. CSW provided information to [employer], who indicated they would follow along for timing of medical readiness to discuss discharge needs.  CSW will continue to follow to facilitate discharge to SNF when medically appropriate.  Laveda Abbe, [facility] Clinical Social Worker [phone]

## 2017-06-17 NOTE — Progress Notes (Signed)
  Echocardiogram 2D Echocardiogram has been performed.  Jennette Dubin 06/17/2017, 3:09 PM

## 2017-06-18 DIAGNOSIS — R531 Weakness: Secondary | ICD-10-CM

## 2017-06-18 LAB — BASIC METABOLIC PANEL
ANION GAP: 10 (ref 5–15)
BUN: 79 mg/dL — ABNORMAL HIGH (ref 6–20)
CALCIUM: 8.4 mg/dL — AB (ref 8.9–10.3)
CO2: 25 mmol/L (ref 22–32)
Chloride: 97 mmol/L — ABNORMAL LOW (ref 101–111)
Creatinine, Ser: 2.38 mg/dL — ABNORMAL HIGH (ref 0.44–1.00)
GFR calc Af Amer: 20 mL/min — ABNORMAL LOW (ref >60)
GFR calc non Af Amer: 17 mL/min — ABNORMAL LOW (ref >60)
GLUCOSE: 119 mg/dL — AB (ref 65–99)
Potassium: 3.1 mmol/L — ABNORMAL LOW (ref 3.5–5.1)
Sodium: 132 mmol/L — ABNORMAL LOW (ref 135–145)

## 2017-06-18 LAB — CBC
HEMATOCRIT: 27.9 % — AB (ref 36.0–46.0)
Hemoglobin: 9.4 g/dL — ABNORMAL LOW (ref 12.0–15.0)
MCH: 32.5 pg (ref 26.0–34.0)
MCHC: 33.7 g/dL (ref 30.0–36.0)
MCV: 96.5 fL (ref 78.0–100.0)
Platelets: 122 10*3/uL — ABNORMAL LOW (ref 150–400)
RBC: 2.89 MIL/uL — ABNORMAL LOW (ref 3.87–5.11)
RDW: 14.7 % (ref 11.5–15.5)
WBC: 11.1 10*3/uL — AB (ref 4.0–10.5)

## 2017-06-18 LAB — CULTURE, BLOOD (ROUTINE X 2): SPECIAL REQUESTS: ADEQUATE

## 2017-06-18 LAB — GLUCOSE, CAPILLARY
GLUCOSE-CAPILLARY: 181 mg/dL — AB (ref 65–99)
GLUCOSE-CAPILLARY: 175 mg/dL — AB (ref 65–99)
GLUCOSE-CAPILLARY: 198 mg/dL — AB (ref 65–99)
Glucose-Capillary: 121 mg/dL — ABNORMAL HIGH (ref 65–99)

## 2017-06-18 LAB — RPR: RPR Ser Ql: NONREACTIVE

## 2017-06-18 LAB — PROTIME-INR
INR: 2.64
Prothrombin Time: 28.7 seconds — ABNORMAL HIGH (ref 11.4–15.2)

## 2017-06-18 LAB — HEMOGLOBIN A1C
HEMOGLOBIN A1C: 6.1 % — AB (ref 4.8–5.6)
Mean Plasma Glucose: 128 mg/dL

## 2017-06-18 MED ORDER — WARFARIN SODIUM 4 MG PO TABS
4.0000 mg | ORAL_TABLET | Freq: Once | ORAL | Status: AC
  Administered 2017-06-18: 4 mg via ORAL
  Filled 2017-06-18: qty 1

## 2017-06-18 MED ORDER — FUROSEMIDE 40 MG PO TABS
40.0000 mg | ORAL_TABLET | Freq: Two times a day (BID) | ORAL | Status: DC
  Administered 2017-06-18 – 2017-06-19 (×2): 40 mg via ORAL
  Filled 2017-06-18 (×2): qty 1

## 2017-06-18 MED ORDER — POTASSIUM CHLORIDE CRYS ER 20 MEQ PO TBCR
40.0000 meq | EXTENDED_RELEASE_TABLET | Freq: Once | ORAL | Status: AC
  Administered 2017-06-18: 40 meq via ORAL
  Filled 2017-06-18: qty 2

## 2017-06-18 NOTE — Progress Notes (Signed)
ANTICOAGULATION CONSULT NOTE  Pharmacy Consult for warfarin Indication: atrial fibrillation  Patient Measurements: Height: 5' (152.4 cm) Weight: 175 lb 14.8 oz (79.8 kg) IBW/kg (Calculated) : 45.5  Vital Signs: Temp: 97.6 F (36.4 C) (07/17 0940) Temp Source: Oral (07/17 0940) BP: 265/195 (07/17 0940) Pulse Rate: 69 (07/17 0940)  Labs:  Recent Labs  06/15/17 1145  06/16/17 0216 06/16/17 0811 06/16/17 1306 06/16/17 1946 06/17/17 0329 06/18/17 0634  HGB 12.1  13.3  --  10.4*  --   --   --  9.3* 9.4*  HCT 36.2  39.0  --  31.2*  --   --   --  28.2* 27.9*  PLT 141*  --  126*  --   --   --  104* 122*  APTT 45*  --   --   --   --   --   --   --   LABPROT 24.2*  --   --  25.4*  --   --  23.8* 28.7*  INR 2.13  --   --  2.27  --   --  2.09 2.64  CREATININE 1.93*  1.70*  --  1.83*  --   --   --  2.00* 2.38*  TROPONINI  --   < > 0.13* 0.19* 0.23* 0.09*  --   --   < > = values in this interval not displayed.  Estimated Creatinine Clearance: 15 mL/min (A) (by C-G formula based on SCr of 2.38 mg/dL (H)).   Medical History: No past medical history on file.  Medications:  Prescriptions Prior to Admission  Medication Sig Dispense Refill Last Dose  . acarbose (PRECOSE) 50 MG tablet Take 50 mg by mouth 3 (three) times daily.  3 06/14/2017 at Unknown time  . acetaminophen (TYLENOL) 500 MG tablet Take 500-1,000 mg by mouth every 6 (six) hours as needed for mild pain or headache.   PRN  . allopurinol (ZYLOPRIM) 100 MG tablet Take 200 mg by mouth daily.  1 06/14/2017 at Unknown time  . carvedilol (COREG) 3.125 MG tablet Take 3.125 mg by mouth 2 (two) times daily.  3 06/14/2017 at 1800  . furosemide (LASIX) 40 MG tablet Take 40 mg by mouth 3 (three) times daily.   06/14/2017 at Unknown time  . gabapentin (NEURONTIN) 600 MG tablet Take 600 mg by mouth 2 (two) times daily.  1 06/14/2017 at Unknown time  . glimepiride (AMARYL) 4 MG tablet Take 4 mg by mouth every morning.   06/14/2017 at  Unknown time  . KLOR-CON 10 10 MEQ tablet Take 10 mEq by mouth daily.  1 06/14/2017 at Unknown time  . Multiple Vitamin (MULTIVITAMIN) LIQD Take 1 tablet by mouth daily.   06/14/2017 at Unknown time  . pantoprazole (PROTONIX) 40 MG tablet Take 40 mg by mouth daily.  1 06/14/2017 at Unknown time  . pravastatin (PRAVACHOL) 20 MG tablet Take 20 mg by mouth daily.  3 06/14/2017 at Unknown time  . vitamin B-12 (CYANOCOBALAMIN) 1000 MCG tablet Take 1,000 mcg by mouth daily.   06/14/2017 at Unknown time  . warfarin (COUMADIN) 2 MG tablet Take 2-4 mg by mouth See admin instructions. Take 1 tab (2mg ) po sun, mon, wed , Friday. Take 2 tabs (4 mg) Tuesday, Thursday, Saturday.  0 06/14/2017 at 1800    Assessment: 83 y.o female presented to ED 7/14 with AMS.   She takes warfarin at home for h/o PAF/ valve replacement. Missed warfarin dose on 7/14. Current INR is  2.6  PTA warfarin dose: 2mg  qSunMWF and 4mg  qTTSat  Goal of Therapy:  INR 2-3.5  (per outpt cardiology note) Monitor platelets by anticoagulation protocol: Yes    Plan:  -Warfarin 4 mg po x1 -Daily INR   Hughes Better, PharmD, BCPS Clinical Pharmacist 06/18/2017 9:53 AM

## 2017-06-18 NOTE — Progress Notes (Signed)
  Speech Language Pathology Treatment: Dysphagia  Patient Details Name: Kara Hammond MRN: [id number] DOB: Nov 25, 1926 Today's Date: 06/18/2017 Time: 3557-3220 SLP Time Calculation (min) (ACUTE ONLY): 10 min  Assessment / Plan / Recommendation Clinical Impression  SLP provided skilled observation during lunch meal. Pt had no overt s/s of aspiration and denies any subjective complaints of dysphagia. Recommend to continue with current diet - no additional SLP f/u needed.   HPI HPI: 83 y.o. female presenting with AMS. PMH is significant for non-rheumatic mitral regurgitation, paroxysmal atrial fibrillation, chronic diastolic heart failure, CKD stage 3, HTN, Chronic anticoagulation, Pulmonary arterial hypertension, non-rheumatic tricuspid valve insufficiency, gout, osteoporosis, diabetes mellitus, and transient cerebral ischemia      SLP Plan  All goals met       Recommendations  Diet recommendations: Regular;Thin liquid Liquids provided via: Cup;Straw Medication Administration: Whole meds with liquid Supervision: Patient able to self feed;Intermittent supervision to cue for compensatory strategies Compensations: Slow rate;Small sips/bites Postural Changes and/or Swallow Maneuvers: Seated upright 90 degrees;Upright 30-60 min after meal                Oral Care Recommendations: Oral care BID Follow up Recommendations: None SLP Visit Diagnosis: Dysphagia, unspecified (R13.10) Plan: All goals met       GO                Germain Osgood 06/18/2017, 1:38 PM  Germain Osgood, M.A. CCC-SLP [phone]

## 2017-06-18 NOTE — Discharge Summary (Signed)
[facility] Discharge Summary  Patient name: Kara Hammond Medical record number: [id number] Date of birth: 30-Oct-1926 Age: 83 y.o. Gender: female Date of Admission: 06/15/2017  Date of Discharge: 06/19/17 Admitting Physician: Leeanne Rio, MD  Primary Care Provider: System, Pcp Not In Consultants: speech, pt, ot  Indication for Hospitalization: RLE cellulitis/ Altered mental status  Discharge Diagnoses/Problem List:  Altered mental status Right Lower extremity Cellulits Atrial fibrillation Mitral valve replacement Chronic diastolic heart failure Chronic kidney disease Hypertension Hyperlipidemia Diabetes mellitus Transient ischemic attack Gout  Disposition: snf  Discharge Condition: good  Discharge Exam: please see progress note from day of discharge  Brief Hospital Course:  Kara Hammond an 83 y.o.femalepresenting with AMS. Per reports from family patient was seen normally at her baseline in the afternoon of 7/14. Her baseline is AOx3, able to do most ADLs. She was checked on around noon on 7/14 and was found slumped over in a chair and was very difficult to arouse. Was mostly unresponsive. Brought to ed for further workup. Upon presentation to the ed a code stroke was fired. A CT and MRI both with no findings to explain new onset AMS. UA and cxr with no findings to explain AMS. Upon exam found to have RLE with increasing erythema and swelling. This apparently first started when her leg was hit with a part of a wheelchair at a dentist's office last week. AMS has resolved after treating with vanc zosyn. Switched to doxycyline for po coverage. RLE erythema and swelling has improved during hospital admission. Has been receiving home dose of lasix. Had increase in creatinine on 7/17 from 2.0 to 2.4. Will instruct to continue lasix 40 mg bid for 1 more day and monitor Cr. Will decrease to 20 mg bid if Cr continues to rise Will also recommend  holding glimeperide and multivitamin while on doxycycline. She will continue doxy for 6 days.  Issues for Follow Up:  1. Take doxycycline for 4 days 2. Review diabetes medications with pcp at hospital follow up 3. Monitor Cr while on lasix   Significant Procedures: none  Significant Labs and Imaging:   Recent Labs Lab 06/17/17 0329 06/18/17 0634 06/19/17 0558  WBC 16.6* 11.1* 7.8  HGB 9.3* 9.4* 9.5*  HCT 28.2* 27.9* 29.1*  PLT 104* 122* 122*    Recent Labs Lab 06/15/17 1145 06/16/17 0216 06/17/17 0329 06/18/17 0634 06/19/17 0558  NA 137  139 140 137 132* 135  K 4.1  3.9 3.4* 3.3* 3.1* 3.6  CL 97*  96* 102 98* 97* 99*  CO2 28 30 30 25 27   GLUCOSE 139*  137* 120* 109* 119* 141*  BUN 64*  59* 61* 69* 79* 81*  CREATININE 1.93*  1.70* 1.83* 2.00* 2.38* 2.31*  CALCIUM 9.6 8.9 8.6* 8.4* 8.7*  ALKPHOS 62  --   --   --   --   AST 28  --   --   --   --   ALT 14  --   --   --   --   ALBUMIN 3.6  --   --   --   --     Results/Tests Pending at Time of Discharge: none  Discharge Medications:  Allergies as of 06/19/2017   No Known Allergies     Medication List    STOP taking these medications   glimepiride 4 MG tablet Commonly known as:  AMARYL     TAKE these medications   acarbose 50  MG tablet Commonly known as:  PRECOSE Take 50 mg by mouth 3 (three) times daily.   acetaminophen 500 MG tablet Commonly known as:  TYLENOL Take 500-1,000 mg by mouth every 6 (six) hours as needed for mild pain or headache.   allopurinol 100 MG tablet Commonly known as:  ZYLOPRIM Take 200 mg by mouth daily.   carvedilol 3.125 MG tablet Commonly known as:  COREG Take 3.125 mg by mouth 2 (two) times daily.   doxycycline 100 MG tablet Commonly known as:  VIBRA-TABS Take 1 tablet (100 mg total) by mouth every 12 (twelve) hours.   furosemide 40 MG tablet Commonly known as:  LASIX Take 40 mg by mouth 3 (three) times daily.   gabapentin 600 MG tablet Commonly known as:   NEURONTIN Take 600 mg by mouth 2 (two) times daily.   KLOR-CON 10 10 MEQ tablet Generic drug:  potassium chloride Take 10 mEq by mouth daily.   multivitamin Liqd Take 1 tablet by mouth daily.   pantoprazole 40 MG tablet Commonly known as:  PROTONIX Take 40 mg by mouth daily.   pravastatin 20 MG tablet Commonly known as:  PRAVACHOL Take 20 mg by mouth daily.   vitamin B-12 1000 MCG tablet Commonly known as:  CYANOCOBALAMIN Take 1,000 mcg by mouth daily.   warfarin 2 MG tablet Commonly known as:  COUMADIN Take 2-4 mg by mouth See admin instructions. Take 1 tab (2mg ) po sun, mon, wed , Friday. Take 2 tabs (4 mg) Tuesday, Thursday, Saturday.      Discharge Instructions: Please refer to Patient Instructions section of EMR for full details.  Patient was counseled important signs and symptoms that should prompt return to medical care, changes in medications, dietary instructions, activity restrictions, and follow up appointments.   Follow-Up Appointments: Please follow up with primary care provider in 1-2 weeks Contact information for after-discharge care    [facility] .   Specialty:  [place] Contact information: [address] [place], [place], DO 06/19/2017, 2:10 PM PGY-1, [place]

## 2017-06-18 NOTE — Progress Notes (Signed)
WEANED OFF O2 [place] TODAY, REAMINS LOW 90S ON RA, NO DYSPNEA/SOB NOTED.

## 2017-06-18 NOTE — Progress Notes (Signed)
Family Medicine Teaching Service Daily Progress Note Intern Pager: [phone]  Patient name: Kara Hammond Medical record number: [id number] Date of birth: [birth year] Age: 83 y.o. Gender: female  Primary Care Provider: System, Pcp Not In Consultants: Neurology Code Status: DNR/DNI  Pt Overview and Major Events to Date:  Kara Hammond is an 83 y.o. female presenting with AMS. Per reports from family patient was seen normally at her baseline in the afternoon of 7/14. Her baseline is AOx3, able to do most ADLs. She was checked on around noon on 7/14 and was found slumped over in a chair and was very difficult to arouse. Was mostly unresponsive. Brought to ed for further workup. Upon presentation to the ed a code stroke was fired. A CT and MRI both with no findings to explain new onset AMS. UA and cxr with no findings to explain AMS. Upon exam found to have RLE with increasing erythema and swelling. This apparently first started when her leg was hit with a part of a wheelchair at a dentist's office last week. AMS has resolved after treating with vanc zosyn. Switched to doxycyline for po coverage. RLE erythema and swelling has improved during hospital admission. Has been receiving home dose of lasx  PMH is significant for non-rheumatic mitral regurgitation, paroxysmal atrial fibrillation, chronic diastolic heart failure, CKD stage 3, HTN, Chronic anticoagulation, Pulmonary arterial hypertension, non-rheumatic tricuspid valve insufficiency, gout, osteoporosis, diabetes mellitus, and transient cerebral ischemia.   Assessment and Plan: AMS Patient noted by family to be sleepier than normal and unable to talk starting 7/14. Given history of increasing erythema and swelling s/p trauma of RLE, elevated white count (19) and fever on presentation it is likely that RLE cellulitis is most likely cause of altered mental status. Was started on vanc/zosyn in ed. WBC at 19, but has been afebrile. Patient alert on am  of 7/16, AOx3 (self and place). Per family reports RLE swelling and erythema have decreased dramatically from 7/14. UA negative, cxr with cardio/pulm congestion but no focal infiltrates. Patient with no pain in neck so meningitis unlikely. Cardiac is another possible etiology, trop of .18->.13->.19->.23->.09. EKG with incomplete LBBB BNP ~2400. UDS negative. Lipid panel normal. 1 blood cx with methicillin resistant coag negative staph, likely contaminant. Stopped vanc/zosyn 7/16. Started on doxycycline 7/16. - Continue doxycycline for 7 days - will continue to follow blood cultures x2 for susceptibilities - F/U TSH, A1c - Continue neuro checks q 4 hours - likely ok for dc today pending snf placement  R lower extremity cellulitis Erythema and edema of right leg. Likely cellulitis due to increased warmth on palpation, erythema, and temperature on admission. History of trauma from scratch in dentist office last week, and patient has had spreading redness since then. Continuing to decrease in size and erythema since 7/14. Area of demarcation greatly decreased this am.  -continue doxycycline for 7 days -continue to mark -continue to monitor   Paroxysmal A-fib / Mitral valve replacement Currently on warfarin for anticoagulation. Per cardiology note goal INR 2-3.5. Current INR of 2.64, within therapeutic range. Unclear if patient has a bioprosthetic or mechanical valve. Per chart review, followed by [facility].  -Coumadin per pharmacy    Chronic Diastolic Heart Failure Currently being monitored by [facility]. CXR showing vascular congestion and left base atelectasis. Decreased crackles today. Lower extremity edema with chronic skin changes noted on exam. Home lasix 40 mg tid. BNP 2371 on admission, no previous BNPs for comparison. Well managed  with lasix dose during admission. - echo consistent with pulm hypertension, LVEF 60% - will decrease lasix to  40mg  bid  CKD  III Current Cr of 2.40. Up from 2.0 on 7/16. Stable from 1.93 on admission. Unsure of baseline. -continue to monitor - have decreased lasix dose to bid -will consider adding gentle fluids for resucitation   Hypotension with a history of HTN Current BP of 128/56. Home coreg 3.125 mg bid. Will continue. - continue home coreg  HLD Home pravastatin 20 mg  -lipid panel normal at admssion  Diabetes Mellitus Current CBG of 119. Home glimepiride 4 mg -continue sensitive sliding scale insulin  -holding home glimepiride while hospitalized   Hx TIA -restarted home pravastatin  Gout Home allopurinol 200 mg  -restarted home dose  FEN/GI: heart healthy diet PPx: warfarin  Disposition: likely to rehab vs snf  Subjective:  Patient doing well this morning. Had some leg pain overnight which she says is typical and comes and goes. Restarted home gabapentin. No complaints this am. Tolerating PO. Still urinating "a lot".  Objective: Temp:  [97.7 F (36.5 C)-98.3 F (36.8 C)] 97.7 F (36.5 C) (07/17 0459) Pulse Rate:  [65-71] 65 (07/17 0459) Resp:  [18-20] 18 (07/17 0459) BP: (112-128)/(43-59) 128/56 (07/17 0459) SpO2:  [91 %-99 %] 99 % (07/17 0459) Weight:  [175 lb 14.8 oz (79.8 kg)] 175 lb 14.8 oz (79.8 kg) (07/17 0533) Physical Exam: General: alert, oriented x3, no acute distress, no complaints Cardiovascular: RRR, Systolic ejection murmur likely grade II. No carotid bruits noted. Palpable pulses bilateral PT/DP, Palpable Radial pulse bilaterally. Respiratory: Crackles present in bilateral lung bases, no increased work of breathing Abdomen: soft, non-tender, non-distended Extremities: able to move all four extremities, Decreased erythema and swelling RLE as compared to 7/16. No purulent drainage noted. 5/5 strength BUE, 5/5 Strength BLE. Neuro: no neuro deficits, CN2-12 intact, 5/5 strength all extremities GU: purewick catheter in place Psych:  appropriate  Laboratory:  Recent Labs Lab 06/16/17 0216 06/17/17 0329 06/18/17 0634  WBC 19.0* 16.6* 11.1*  HGB 10.4* 9.3* 9.4*  HCT 31.2* 28.2* 27.9*  PLT 126* 104* 122*    Recent Labs Lab 06/15/17 1145 06/16/17 0216 06/17/17 0329 06/18/17 0634  NA 137  139 140 137 132*  K 4.1  3.9 3.4* 3.3* 3.1*  CL 97*  96* 102 98* 97*  CO2 28 30 30 25   BUN 64*  59* 61* 69* 79*  CREATININE 1.93*  1.70* 1.83* 2.00* 2.38*  CALCIUM 9.6 8.9 8.6* 8.4*  PROT 7.3  --   --   --   BILITOT 0.8  --   --   --   ALKPHOS 62  --   --   --   ALT 14  --   --   --   AST 28  --   --   --   GLUCOSE 139*  137* 120* 109* 119*    Imaging/Diagnostic Tests: CLINICAL DATA:  New onset generalized weakness and confusion beginning yesterday.  EXAM: MRI HEAD WITHOUT CONTRAST  TECHNIQUE: Multiplanar, multiecho pulse sequences of the brain and surrounding structures were obtained without intravenous contrast.  COMPARISON:  CT head without contrast 11:48 a.m. the same day.  FINDINGS: Brain: The diffusion-weighted images demonstrate no acute or subacute infarction. Advanced atrophy is again noted. Relatively little white matter disease is present. Dilated perivascular spaces are noted within the basal ganglia. The brainstem and cerebellum are normal. The calcified lesion at the foramen of image NG likely reflects  choroid. No definite lesion is present otherwise. The study is mildly degraded by patient motion. Ventricles are proportionate to the degree of atrophy. No significant extra-axial fluid collection is present. Next healed flow is present in the major intracranial arteries.  Vascular: Flow is present in the major intracranial arteries.  Skull and upper cervical spine: The craniocervical junction is distorted due to patient motion. Upper cervical spine stenosis is suspected. Midline sagittal structures are otherwise unremarkable.  Sinuses/Orbits: A polyp or mucous retention cyst  is present inferiorly in the left maxillary sinus. The paranasal sinuses and mastoid air cells are otherwise clear. Bilateral lens replacements are present. The globes and the orbits are otherwise normal.  IMPRESSION: 1. No acute or focal lesion to explain the patient's new onset weakness or confusion. 2. Calcified lesion at the foramen of mention D likely reflects choroid. 3. Advanced generalized atrophy without significant white matter disease. 4. The degenerative changes with probable stenosis the upper cervical spine. The images are distorted by patient motion.   Electronically Signed   By: San Morelle M.D.   On: 06/15/2017 17:20  CLINICAL DATA:  Code stroke. LEFT-sided weakness and facial droop. Slurred speech.  EXAM: CT HEAD WITHOUT CONTRAST  TECHNIQUE: Contiguous axial images were obtained from the base of the skull through the vertex without intravenous contrast.  COMPARISON:  None.  FINDINGS: Brain: No evidence for acute infarction, hemorrhage, hydrocephalus, or extra-axial fluid. Advanced cerebral and cerebellar atrophy. Extensive white matter disease.  There is a small cluster of calcifications near the midline foramen of Magendie inferior fourth ventricle, 5 mm in diameter, reference images 5 series 3 and image 29 series 6. While this most often simply represents choroid plexus, a small calcified subependymoma is not completely excluded.  Vascular: Calcification of the cavernous internal carotid arteries consistent with cerebrovascular atherosclerotic disease. No signs of intracranial large vessel occlusion.  Skull: Normal. Negative for fracture or focal lesion.  Sinuses/Orbits: No acute finding.  BILATERAL cataract extraction.  Other: None.  ASPECTS [facility] Stroke Program Early CT Score)  - Ganglionic level infarction (caudate, lentiform nuclei, internal capsule, insula, M1-M3 cortex): 7  - Supraganglionic infarction  (M4-M6 cortex): 3  Total score (0-10 with 10 being normal): 10  IMPRESSION: 1. Advanced atrophy and small vessel disease. No acute intracranial findings. See discussion above regarding possible incidental choroid plexus calcification versus indolent calcified lesion at the foramen Magendie. 2. ASPECTS is 10. These results were called by telephone at the time of interpretation on 06/15/2017 at 12:01 pm to Dr. Rory Percy, who verbally acknowledged these results.   Guadalupe Dawn, MD 06/18/2017, 9:33 AM PGY-1, [place] Intern pager: [phone], text pages welcome

## 2017-06-18 NOTE — Discharge Instructions (Signed)

## 2017-06-19 LAB — BASIC METABOLIC PANEL
ANION GAP: 9 (ref 5–15)
BUN: 81 mg/dL — ABNORMAL HIGH (ref 6–20)
CO2: 27 mmol/L (ref 22–32)
Calcium: 8.7 mg/dL — ABNORMAL LOW (ref 8.9–10.3)
Chloride: 99 mmol/L — ABNORMAL LOW (ref 101–111)
Creatinine, Ser: 2.31 mg/dL — ABNORMAL HIGH (ref 0.44–1.00)
GFR calc Af Amer: 20 mL/min — ABNORMAL LOW (ref >60)
GFR, EST NON AFRICAN AMERICAN: 18 mL/min — AB (ref >60)
Glucose, Bld: 141 mg/dL — ABNORMAL HIGH (ref 65–99)
Potassium: 3.6 mmol/L (ref 3.5–5.1)
SODIUM: 135 mmol/L (ref 135–145)

## 2017-06-19 LAB — CBC
HCT: 29.1 % — ABNORMAL LOW (ref 36.0–46.0)
Hemoglobin: 9.5 g/dL — ABNORMAL LOW (ref 12.0–15.0)
MCH: 31.9 pg (ref 26.0–34.0)
MCHC: 32.6 g/dL (ref 30.0–36.0)
MCV: 97.7 fL (ref 78.0–100.0)
PLATELETS: 122 10*3/uL — AB (ref 150–400)
RBC: 2.98 MIL/uL — AB (ref 3.87–5.11)
RDW: 14.9 % (ref 11.5–15.5)
WBC: 7.8 10*3/uL (ref 4.0–10.5)

## 2017-06-19 LAB — FOLATE RBC
Folate, Hemolysate: >620 ng/mL
Folate, RBC: >1968 ng/mL (ref >498)
Hematocrit: 31.5 % — ABNORMAL LOW (ref 34.0–46.6)

## 2017-06-19 LAB — PROTIME-INR
INR: 2.52
PROTHROMBIN TIME: 27.6 s — AB (ref 11.4–15.2)

## 2017-06-19 LAB — GLUCOSE, CAPILLARY
GLUCOSE-CAPILLARY: 130 mg/dL — AB (ref 65–99)
Glucose-Capillary: 133 mg/dL — ABNORMAL HIGH (ref 65–99)
Glucose-Capillary: 214 mg/dL — ABNORMAL HIGH (ref 65–99)
Glucose-Capillary: 168 mg/dL — ABNORMAL HIGH (ref 65–99)

## 2017-06-19 MED ORDER — WARFARIN SODIUM 2 MG PO TABS
2.0000 mg | ORAL_TABLET | Freq: Once | ORAL | Status: AC
  Administered 2017-06-19: 2 mg via ORAL
  Filled 2017-06-19: qty 1

## 2017-06-19 MED ORDER — GABAPENTIN 600 MG PO TABS: 600.0000 mg | ORAL_TABLET | Freq: Every day | ORAL | Status: DC

## 2017-06-19 MED ORDER — DOXYCYCLINE HYCLATE 100 MG PO TABS: 100.0000 mg | ORAL_TABLET | Freq: Two times a day (BID) | ORAL | 0 refills | Status: AC

## 2017-06-19 NOTE — Progress Notes (Signed)
Family Medicine Teaching Service Daily Progress Note Intern Pager: [phone]  Patient name: Kara Hammond Medical record number: [id number] Date of birth: May 23, 1926 Age: 83 y.o. Gender: female  Primary Care Provider: System, Pcp Not In Consultants: Neurology  Code Status: DNR/DNI   Pt Overview and Major Events to Date:  Kara Hammond an 83 y.o.femalepresenting with AMS. Admitted to [facility] on 06/15/17.   Assessment and Plan: Kara Hammond an 83 y.o.femalepresenting with AMS. PMH is significant for non-rheumatic mitral regurgitation, paroxysmal atrial fibrillation, chronic diastolic heart failure, CKD stage 3, HTN, Chronic anticoagulation, Pulmonary arterial hypertension, non-rheumatic tricuspid valve insufficiency, gout, osteoporosis, diabetes mellitus, and transient cerebral ischemia.   Switched to doxycyline for po coverage. RLE erythema and swelling has improved during hospital admission. Has been receiving home dose of lasx  AMS Patient noted by family to be sleepier than normal and unable to talk starting 7/14. A CT and MRI both with no findings to explain new onset AMS. History of increasing erythema and swelling s/p trauma of RLE, elevated white count (19) and fever on presentation it is likely that RLE cellulitis is most likely cause of altered mental status. Was started on vanc/zosyn in ed. Current WBC of 7.8 but has been afebrile. Patient alert on am of 7/16, AOx3 (self and place). Per family reports RLE swelling and erythema have decreased dramatically from 7/14. UA negative, cxr with cardio/pulm congestion but no focal infiltrates. Patient with no pain in neck so meningitis unlikely. Cardiac is another possible etiology, trop of .18->.13->.19->.23->.09. EKG with incomplete LBBB BNP ~2400. UDS negative. Lipid panel normal. 1 blood cx with methicillin resistant coag negative staph, likely contaminant. Stopped vanc/zosyn 7/16. Started on doxycycline 7/16. TSH wnl of 1.365.  Hemoglobin A1C of 6.1 on 7/14. Patient was oriented to person, place, and time.  - Continue doxycycline for 7 days - will continue to follow blood cultures x2 for susceptibilities - Continue neuro checks q 4 hours - likely ok for dc today pending snf placement  R lower extremity cellulitis Erythema andedema of right leg. Likely cellulitis due to increased warmth on palpation, erythema, and temperature on admission. History of trauma from scratch in dentist office last week, and patient has had spreading redness since then. Continuing to decrease in size and erythema since 7/14. Area of demarcation greatly decreased this am. Slight tenderness to palpation.  -continue doxycycline 100 mg q12h (day 3) for total of 7 days -continue to mark -continue to monitor   Paroxysmal A-fib / Mitral valve replacement Currently on warfarin for anticoagulation. Per cardiology note goal INR 2-3.5. Current therapeutic INR of 2.52, within therapeutic range. Unclear if patient has a bioprosthetic or mechanical valve.Per chart review, followed by [facility].  -Coumadin per pharmacy    Chronic Diastolic Heart Failure Currently being monitored by [facility]. CXR showing vascular congestion and left base atelectasis. Decreased crackles today. Lower extremity edema with chronic skin changes noted on exam.Home lasix 40 mg tid. BNP 2371 on admission, no previous BNPs for comparison. Well managed with lasix dose during admission. Leg shows decreased edema since admission - echo consistent with pulm hypertension, LVEF 60% - will decrease lasix to 40mg  bid  CKD III Current Cr of 2.31. Up from 2.0 on 7/16. Stable from 1.93 on admission. Unsure of baseline. -continue to monitor - have decreased lasix dose to bid -will consider adding gentle fluids for resucitation   Hypotension with a history of HTN Current BP of 126/50. Home coreg  3.125 mg bid. Will continue. - continue home  coreg  HLD Home pravastatin 20 mg  -lipid panel normal at admssion  Diabetes Mellitus Current CBG of 130. Home glimepiride 4 mg -continue sensitive sliding scale insulin  -holding home glimepiride while hospitalized   Hx TIA -restarted home pravastatin  Gout Home allopurinol 200 mg  -restarted home dose  FEN/GI: heart healthy diet PPx: warfarin   Disposition: snf   Subjective:  Patient today notes improvement. States she feels better and is no longer altered. Patient denies dizziness, chest pain, SOB, nausea, vomiting, or diarrhea. Patient is tolerating antibiotics well. Patient is awake, alert, and oriented to person, place and time. Patient is requesting PT to help her walk around. Patient states she took 2 pills last night for the first time that burned when she took them but is unsure what pills they were.   Objective: Temp:  [97.6 F (36.4 C)-98.6 F (37 C)] 97.8 F (36.6 C) (07/18 0507) Pulse Rate:  [58-79] 58 (07/18 0600) Resp:  [18-20] 18 (07/18 0507) BP: (93-265)/(41-195) 126/50 (07/18 0600) SpO2:  [93 %-100 %] 100 % (07/18 0600) Physical Exam: General: awake and alert, oriented to person, place, and time  Cardiovascular: RRR, no MRG Respiratory: CTAB, no increased work of breathing  Abdomen: soft, non tender, non distended  Extremities: able to move all 4 extremities, decreased erythema and edema in RLE. No purulent drainage. 5/5 muscle strength bilaterally.  Neuro: CN2-12 intact, 5/5 strength bilaterally in all extremities   Laboratory:  Recent Labs Lab 06/17/17 0329 06/18/17 0634 06/19/17 0558  WBC 16.6* 11.1* 7.8  HGB 9.3* 9.4* 9.5*  HCT 28.2* 27.9* 29.1*  PLT 104* 122* 122*    Recent Labs Lab 06/15/17 1145  06/17/17 0329 06/18/17 0634 06/19/17 0558  NA 137  139  < > 137 132* 135  K 4.1  3.9  < > 3.3* 3.1* 3.6  CL 97*  96*  < > 98* 97* 99*  CO2 28  < > 30 25 27   BUN 64*  59*  < > 69* 79* 81*  CREATININE 1.93*  1.70*  < > 2.00*  2.38* 2.31*  CALCIUM 9.6  < > 8.6* 8.4* 8.7*  PROT 7.3  --   --   --   --   BILITOT 0.8  --   --   --   --   ALKPHOS 62  --   --   --   --   ALT 14  --   --   --   --   AST 28  --   --   --   --   GLUCOSE 139*  137*  < > 109* 119* 141*  < > = values in this interval not displayed.  Urinalysis    Component Value Date/Time   COLORURINE YELLOW 06/15/2017 1404   APPEARANCEUR HAZY (A) 06/15/2017 1404   LABSPEC 1.009 06/15/2017 1404   PHURINE 7.0 06/15/2017 1404   GLUCOSEU NEGATIVE 06/15/2017 1404   HGBUR SMALL (A) 06/15/2017 1404   BILIRUBINUR NEGATIVE 06/15/2017 1404   KETONESUR NEGATIVE 06/15/2017 1404   PROTEINUR 100 (A) 06/15/2017 1404   NITRITE NEGATIVE 06/15/2017 1404   LEUKOCYTESUR SMALL (A) 06/15/2017 1404    Drugs of Abuse     Component Value Date/Time   LABOPIA NONE DETECTED 06/15/2017 1404   COCAINSCRNUR NONE DETECTED 06/15/2017 1404   LABBENZ NONE DETECTED 06/15/2017 1404   AMPHETMU NONE DETECTED 06/15/2017 1404   THCU NONE DETECTED 06/15/2017 1404  LABBARB NONE DETECTED 06/15/2017 1404      Imaging/Diagnostic Tests: Mr Brain Wo Contrast (neuro Protocol)  Result Date: 06/15/2017 CLINICAL DATA:  New onset generalized weakness and confusion beginning yesterday. EXAM: MRI HEAD WITHOUT CONTRAST TECHNIQUE: Multiplanar, multiecho pulse sequences of the brain and surrounding structures were obtained without intravenous contrast. COMPARISON:  CT head without contrast 11:48 a.m. the same day. FINDINGS: Brain: The diffusion-weighted images demonstrate no acute or subacute infarction. Advanced atrophy is again noted. Relatively little white matter disease is present. Dilated perivascular spaces are noted within the basal ganglia. The brainstem and cerebellum are normal. The calcified lesion at the foramen of image NG likely reflects choroid. No definite lesion is present otherwise. The study is mildly degraded by patient motion. Ventricles are proportionate to the degree of  atrophy. No significant extra-axial fluid collection is present. Next healed flow is present in the major intracranial arteries. Vascular: Flow is present in the major intracranial arteries. Skull and upper cervical spine: The craniocervical junction is distorted due to patient motion. Upper cervical spine stenosis is suspected. Midline sagittal structures are otherwise unremarkable. Sinuses/Orbits: A polyp or mucous retention cyst is present inferiorly in the left maxillary sinus. The paranasal sinuses and mastoid air cells are otherwise clear. Bilateral lens replacements are present. The globes and the orbits are otherwise normal. IMPRESSION: 1. No acute or focal lesion to explain the patient's new onset weakness or confusion. 2. Calcified lesion at the foramen of mention D likely reflects choroid. 3. Advanced generalized atrophy without significant white matter disease. 4. The degenerative changes with probable stenosis the upper cervical spine. The images are distorted by patient motion. Electronically Signed   By: San Morelle M.D.   On: 06/15/2017 17:20   Dg Chest Port 1 View  Result Date: 06/15/2017 CLINICAL DATA:  Left-sided weakness EXAM: PORTABLE CHEST 1 VIEW COMPARISON:  None. FINDINGS: Prior median sternotomy. Cardiomegaly with vascular congestion. Left basilar atelectasis. No effusion or acute bony abnormality. IMPRESSION: Cardiomegaly with vascular congestion.  Left base atelectasis. Electronically Signed   By: Rolm Baptise M.D.   On: 06/15/2017 12:45   Ct Head Code Stroke W/o Cm  Result Date: 06/15/2017 CLINICAL DATA:  Code stroke. LEFT-sided weakness and facial droop. Slurred speech. EXAM: CT HEAD WITHOUT CONTRAST TECHNIQUE: Contiguous axial images were obtained from the base of the skull through the vertex without intravenous contrast. COMPARISON:  None. FINDINGS: Brain: No evidence for acute infarction, hemorrhage, hydrocephalus, or extra-axial fluid. Advanced cerebral and  cerebellar atrophy. Extensive white matter disease. There is a small cluster of calcifications near the midline foramen of Magendie inferior fourth ventricle, 5 mm in diameter, reference images 5 series 3 and image 29 series 6. While this most often simply represents choroid plexus, a small calcified subependymoma is not completely excluded. Vascular: Calcification of the cavernous internal carotid arteries consistent with cerebrovascular atherosclerotic disease. No signs of intracranial large vessel occlusion. Skull: Normal. Negative for fracture or focal lesion. Sinuses/Orbits: No acute finding.  BILATERAL cataract extraction. Other: None. ASPECTS [facility] Stroke Program Early CT Score) - Ganglionic level infarction (caudate, lentiform nuclei, internal capsule, insula, M1-M3 cortex): 7 - Supraganglionic infarction (M4-M6 cortex): 3 Total score (0-10 with 10 being normal): 10 IMPRESSION: 1. Advanced atrophy and small vessel disease. No acute intracranial findings. See discussion above regarding possible incidental choroid plexus calcification versus indolent calcified lesion at the foramen Magendie. 2. ASPECTS is 10. These results were called by telephone at the time of interpretation on 06/15/2017 at 12:01 pm  to Dr. Rory Percy, who verbally acknowledged these results. Electronically Signed   By: Staci Righter M.D.   On: 06/15/2017 12:04     Caroline More, DO 06/19/2017, 9:35 AM PGY-1, [place] Intern pager: [phone], text pages welcome

## 2017-06-19 NOTE — Care Management Important Message (Signed)
Important Message  Patient Details  Name: Kara Hammond MRN: [id number] Date of Birth: 11-01-1926   Medicare Important Message Given:  Yes    Orbie Pyo 06/19/2017, 11:27 AM

## 2017-06-19 NOTE — Progress Notes (Signed)
PT Cancellation Note  Patient Details Name: Kara Hammond MRN: [id number] DOB: September 01, 1926   Cancelled Treatment:     Attempted to see, patient for discharge via PTARR at this time   Duncan Dull 06/19/2017, 4:25 PM

## 2017-06-19 NOTE — Progress Notes (Signed)
Pt being discharged to skilled nursing facility per orders from MD. Pt and family made aware of transfer. Pt exited hospital via stretcher. Pt's IV was removed prior to discharge. All questions and concerns were addressed.

## 2017-06-19 NOTE — Progress Notes (Signed)
RN called and gave report to [facility]

## 2017-06-19 NOTE — Progress Notes (Signed)
ANTICOAGULATION CONSULT NOTE  Pharmacy Consult for warfarin Indication: atrial fibrillation  Patient Measurements: Height: 5' (152.4 cm) Weight: 175 lb 14.8 oz (79.8 kg) IBW/kg (Calculated) : 45.5  Vital Signs: Temp: 97.8 F (36.6 C) (07/18 0507) Temp Source: Oral (07/18 0507) BP: 126/50 (07/18 0600) Pulse Rate: 58 (07/18 0600)  Recent Labs  06/16/17 1306 06/16/17 1946  06/17/17 0329 06/18/17 0634 06/19/17 0558  HGB  --   --   < > 9.3* 9.4* 9.5*  HCT  --   --   --  28.2* 27.9* 29.1*  PLT  --   --   --  104* 122* 122*  LABPROT  --   --   --  23.8* 28.7* 27.6*  INR  --   --   --  2.09 2.64 2.52  CREATININE  --   --   --  2.00* 2.38* 2.31*  TROPONINI 0.23* 0.09*  --   --   --   --   < > = values in this interval not displayed.  Medical History: No past medical history on file.  Medications:  Prescriptions Prior to Admission  Medication Sig Dispense Refill Last Dose  . acarbose (PRECOSE) 50 MG tablet Take 50 mg by mouth 3 (three) times daily.  3 06/14/2017 at Unknown time  . acetaminophen (TYLENOL) 500 MG tablet Take 500-1,000 mg by mouth every 6 (six) hours as needed for mild pain or headache.   PRN  . allopurinol (ZYLOPRIM) 100 MG tablet Take 200 mg by mouth daily.  1 06/14/2017 at Unknown time  . carvedilol (COREG) 3.125 MG tablet Take 3.125 mg by mouth 2 (two) times daily.  3 06/14/2017 at 1800  . furosemide (LASIX) 40 MG tablet Take 40 mg by mouth 3 (three) times daily.   06/14/2017 at Unknown time  . gabapentin (NEURONTIN) 600 MG tablet Take 600 mg by mouth 2 (two) times daily.  1 06/14/2017 at Unknown time  . glimepiride (AMARYL) 4 MG tablet Take 4 mg by mouth every morning.   06/14/2017 at Unknown time  . KLOR-CON 10 10 MEQ tablet Take 10 mEq by mouth daily.  1 06/14/2017 at Unknown time  . Multiple Vitamin (MULTIVITAMIN) LIQD Take 1 tablet by mouth daily.   06/14/2017 at Unknown time  . pantoprazole (PROTONIX) 40 MG tablet Take 40 mg by mouth daily.  1 06/14/2017 at  Unknown time  . pravastatin (PRAVACHOL) 20 MG tablet Take 20 mg by mouth daily.  3 06/14/2017 at Unknown time  . vitamin B-12 (CYANOCOBALAMIN) 1000 MCG tablet Take 1,000 mcg by mouth daily.   06/14/2017 at Unknown time  . warfarin (COUMADIN) 2 MG tablet Take 2-4 mg by mouth See admin instructions. Take 1 tab (2mg ) po sun, mon, wed , Friday. Take 2 tabs (4 mg) Tuesday, Thursday, Saturday.  0 06/14/2017 at 1800    Assessment: 83 y.o female presented to ED 7/14 with AMS.   She takes warfarin at home for h/o AFib / mitral valve replacement. Missed warfarin dose on 7/14 Current INR is 2.5  PTA warfarin dose: 2mg  qSunMWF and 4mg  qTTSat  Goal of Therapy:  INR 2-3.5  (per outpt cardiology note) Monitor platelets by anticoagulation protocol: Yes    Plan:  -Warfarin 2 mg po x1 -Daily INR   Hughes Better, PharmD, BCPS Clinical Pharmacist 06/19/2017 8:29 AM

## 2017-06-19 NOTE — Progress Notes (Signed)
Discharge to: [facility] Anticipated discharge date: 06/19/17 Family notified: Yes, by phone Transportation by: [facility]  Report #: [phone]  [place] signing off.  Laveda Abbe LCSW [phone]

## 2017-06-20 LAB — CULTURE, BLOOD (ROUTINE X 2)
Culture: NO GROWTH
Special Requests: ADEQUATE

## 2017-11-07 ENCOUNTER — Emergency Department (HOSPITAL_COMMUNITY)
Admission: EM | Admit: 2017-11-07 | Discharge: 2017-11-07 | Disposition: A | Discharge status: Home / Self Care | Payer: Medicare Other | Attending: Emergency Medicine | Admitting: Emergency Medicine

## 2017-11-07 ENCOUNTER — Encounter (HOSPITAL_COMMUNITY): Payer: Self-pay

## 2017-11-07 ENCOUNTER — Other Ambulatory Visit: Payer: Self-pay

## 2017-11-07 DIAGNOSIS — W228XXA Striking against or struck by other objects, initial encounter: Secondary | ICD-10-CM | POA: Diagnosis not present

## 2017-11-07 DIAGNOSIS — E119 Type 2 diabetes mellitus without complications: Secondary | ICD-10-CM | POA: Diagnosis not present

## 2017-11-07 DIAGNOSIS — Z7901 Long term (current) use of anticoagulants: Secondary | ICD-10-CM | POA: Diagnosis not present

## 2017-11-07 DIAGNOSIS — Y9301 Activity, walking, marching and hiking: Secondary | ICD-10-CM | POA: Diagnosis not present

## 2017-11-07 DIAGNOSIS — Z79899 Other long term (current) drug therapy: Secondary | ICD-10-CM | POA: Diagnosis not present

## 2017-11-07 DIAGNOSIS — Y998 Other external cause status: Secondary | ICD-10-CM | POA: Insufficient documentation

## 2017-11-07 DIAGNOSIS — Z23 Encounter for immunization: Secondary | ICD-10-CM | POA: Diagnosis not present

## 2017-11-07 DIAGNOSIS — I509 Heart failure, unspecified: Secondary | ICD-10-CM | POA: Diagnosis not present

## 2017-11-07 DIAGNOSIS — S81811A Laceration without foreign body, right lower leg, initial encounter: Secondary | ICD-10-CM | POA: Diagnosis not present

## 2017-11-07 DIAGNOSIS — Y92017 Garden or yard in single-family (private) house as the place of occurrence of the external cause: Secondary | ICD-10-CM | POA: Diagnosis not present

## 2017-11-07 HISTORY — DX: Heart failure, unspecified: I50.9

## 2017-11-07 HISTORY — DX: Type 2 diabetes mellitus without complications: E11.9

## 2017-11-07 MED ORDER — CEPHALEXIN 500 MG PO CAPS: 500.0000 mg | ORAL_CAPSULE | Freq: Four times a day (QID) | ORAL | 0 refills | Status: DC

## 2017-11-07 MED ORDER — LIDOCAINE-EPINEPHRINE (PF) 2 %-1:200000 IJ SOLN
20.0000 mL | Freq: Once | INTRAMUSCULAR | Status: AC
  Administered 2017-11-07: 20 mL
  Filled 2017-11-07: qty 20

## 2017-11-07 MED ORDER — TETANUS-DIPHTH-ACELL PERTUSSIS 5-2.5-18.5 LF-MCG/0.5 IM SUSP
0.5000 mL | Freq: Once | INTRAMUSCULAR | Status: AC
  Administered 2017-11-07: 0.5 mL via INTRAMUSCULAR
  Filled 2017-11-07: qty 0.5

## 2017-11-07 NOTE — ED Notes (Signed)
Bed: WA18 Expected date:  Expected time:  Means of arrival:  Comments: ems 

## 2017-11-07 NOTE — ED Provider Notes (Signed)
[facility] DEPT Provider Note   CSN: [id number] Arrival date & time: 11/07/17  1352     History   Chief Complaint Chief Complaint  Patient presents with  . Extremity Laceration    HPI Kara Hammond is a 83 y.o. female.  The history is provided by the patient and medical records. No language interpreter was used.    Kara Hammond is a 83 y.o. female  with a PMH of CHF, DM who presents to the Emergency Department complaining of laceration to the right lower extremity which occurred today. Patient was walking up her outside wooden steps when she must have hit her leg. She has chronic decreased sensation to her lower extremities. She did not feel the laceration, but looked down and saw blood. Not sure of tetanus status. No medications taken prior to arrival for symptoms. No recent illness, cough, congestion, dizziness, dysuria, fever, chills, n/v/d.   Past Medical History:  Diagnosis Date  . CHF (congestive heart failure) ([place])   . Diabetes mellitus without complication [facility])     Patient Active Problem List   Diagnosis Date Noted  . Weakness   . Altered mental status 06/15/2017  . Cellulitis 06/15/2017    Past Surgical History:  Procedure Laterality Date  . CARDIAC SURGERY    . HERNIA REPAIR    . HIP SURGERY      OB History    No data available       Home Medications    Prior to Admission medications   Medication Sig Start Date End Date Taking? Authorizing Provider  acarbose (PRECOSE) 50 MG tablet Take 50 mg by mouth 3 (three) times daily. 05/19/17   [provider]  acetaminophen (TYLENOL) 500 MG tablet Take 500-1,000 mg by mouth every 6 (six) hours as needed for mild pain or headache.    [provider]  allopurinol (ZYLOPRIM) 100 MG tablet Take 200 mg by mouth daily. 04/27/17   [provider]  carvedilol (COREG) 3.125 MG tablet Take 3.125 mg by mouth 2 (two) times daily. 03/18/17   [provider]  cephALEXin (KEFLEX) 500 MG capsule Take 1 capsule (500 mg total) by mouth 4 (four) times daily. 11/07/17   Ward, Ozella Almond, PA-C  furosemide (LASIX) 40 MG tablet Take 40 mg by mouth 3 (three) times daily. 03/25/17   [provider]  gabapentin (NEURONTIN) 600 MG tablet Take 600 mg by mouth 2 (two) times daily. 04/26/17   [provider]  KLOR-CON 10 10 MEQ tablet Take 10 mEq by mouth daily. 05/24/17   [provider]  Multiple Vitamin (MULTIVITAMIN) LIQD Take 1 tablet by mouth daily.    [provider]  pantoprazole (PROTONIX) 40 MG tablet Take 40 mg by mouth daily. 04/20/17   [provider]  pravastatin (PRAVACHOL) 20 MG tablet Take 20 mg by mouth daily. 04/06/17   [provider]  vitamin B-12 (CYANOCOBALAMIN) 1000 MCG tablet Take 1,000 mcg by mouth daily.    [provider]  warfarin (COUMADIN) 2 MG tablet Take 2-4 mg by mouth See admin instructions. Take 1 tab (2mg ) po sun, mon, wed , Friday. Take 2 tabs (4 mg) Tuesday, Thursday, Saturday. 06/01/17   [provider]    Family History History reviewed. No pertinent family history.  Social History Social History   Tobacco Use  . Smoking status: Never Smoker  . Smokeless tobacco: Never Used  Substance Use Topics  . Alcohol use: No  Frequency: Never  . Drug use: No     Allergies   Patient has no known allergies.   Review of Systems Review of Systems  Constitutional: Negative for chills and fever.  Skin: Positive for wound.  All other systems reviewed and are negative.    Physical Exam Updated Vital Signs BP 134/70 (BP Location: Left Arm)   Pulse 62   Temp (!) 97.3 F (36.3 C)   Resp 16   Ht 5\' 4"  (1.626 m)   Wt 90.7 kg (200 lb)   SpO2 96%   BMI 34.33 kg/m   Physical Exam  Constitutional: She is oriented to person, place, and time. She appears well-developed and well-nourished. No distress.  HENT:  Head: Normocephalic and  atraumatic.  Cardiovascular: Normal rate, regular rhythm and normal heart sounds.  No murmur heard. Pulmonary/Chest: Effort normal and breath sounds normal. No respiratory distress.  Abdominal: Soft. She exhibits no distension. There is no tenderness.  Musculoskeletal: She exhibits no edema.  Neurological: She is alert and oriented to person, place, and time.  Skin: Skin is warm and dry.  9 cm laceration to right lower extremity.   Nursing note and vitals reviewed.    ED Treatments / Results  Labs (all labs ordered are listed, but only abnormal results are displayed) Labs Reviewed - No data to display  EKG  EKG Interpretation None       Radiology No results found.  Procedures .Marland KitchenLaceration Repair Date/Time: 11/07/2017 4:34 PM Performed by: Ward, Ozella Almond, PA-C Authorized by: Ward, Ozella Almond, PA-C   Consent:    Consent obtained:  Verbal   Consent given by:  Patient   Risks discussed:  Infection, pain, poor cosmetic result, poor wound healing and need for additional repair Anesthesia (see MAR for exact dosages):    Anesthesia method:  Local infiltration   Local anesthetic:  Lidocaine 2% WITH epi (8 ml) Laceration details:    Location:  Leg   Leg location:  R upper leg   Length (cm):  9 Repair type:    Repair type:  Simple Pre-procedure details:    Preparation:  Patient was prepped and draped in usual sterile fashion Exploration:    Hemostasis achieved with:  Direct pressure   Wound exploration: entire depth of wound probed and visualized     Wound extent: no foreign bodies/material noted   Treatment:    Area cleansed with:  Betadine   Amount of cleaning:  Standard   Irrigation solution:  Sterile saline Skin repair:    Repair method:  Sutures   Suture size:  3-0   Suture material:  Prolene   Suture technique:  Simple interrupted   Number of sutures:  16 Approximation:    Approximation:  Close Post-procedure details:    Dressing:  Antibiotic  ointment and non-adherent dressing   Patient tolerance of procedure:  Tolerated well, no immediate complications   (including critical care time)  Medications Ordered in ED Medications  lidocaine-EPINEPHrine (XYLOCAINE W/EPI) 2 %-1:200000 (PF) injection 20 mL (20 mLs Infiltration Given 11/07/17 1556)  Tdap (BOOSTRIX) injection 0.5 mL (0.5 mLs Intramuscular Given 11/07/17 1436)     Initial Impression / Assessment and Plan / ED Course  I have reviewed the triage vital signs and the nursing notes.  Pertinent labs & imaging results that were available during my care of the patient were reviewed by me and considered in my medical decision making (see chart for details).    Kara Hammond is a  83 y.o. female who presents to ED for laceration of right lower extremity. Wound thoroughly cleaned in ED today. Wound explored and bottom of wound seen in a bloodless field. Laceration repaired as dictated above. Patient counseled on home wound care. She has an appointment with PCP next week and will have wound checked at that time. Stitches need to be removed in 10-14 days. Will start on keflex for abx prevention. Patient was urged to return to the Emergency Department for worsening pain, swelling, expanding erythema especially if it streaks away from the affected area, fever, or for any additional concerns. Patient verbalized understanding. All questions answered.  Patient seen by and discussed with Dr. Venora Maples who agrees with treatment plan.    Final Clinical Impressions(s) / ED Diagnoses   Final diagnoses:  Laceration of right lower extremity, initial encounter    ED Discharge Orders        Ordered    cephALEXin (KEFLEX) 500 MG capsule  4 times daily     11/07/17 1555       Ward, Ozella Almond, PA-C 11/07/17 1636    Jola Schmidt, MD 11/07/17 [phone]

## 2017-11-07 NOTE — ED Notes (Signed)
Pt was placed on Purewick catheter, pericare was performed prior to placement.

## 2017-11-07 NOTE — ED Triage Notes (Signed)
Per EMS- Patient was walking up some wooden steps tht lead to her house and scraped her right shin on the wood causing a laceration. EMS reports that the patient normally has large amounts of fluid on bilateral legs and patient has more fluid leaking than blood.

## 2017-11-07 NOTE — ED Notes (Signed)
Called patient's son and informed of discharge. Patient's son, Inocente Salles stated that his sister was coming to get the patient for discharge.

## 2017-11-07 NOTE — Discharge Instructions (Signed)
It was my pleasure taking care of you today!   Please take all of your antibiotics until finished!  Keep your appointment with your primary care doctor next week. Let them know about your hospital visit today and have them check your leg. Your stitches will likely need to be removed on 12/17. You can see your primary care doctor, the urgent care or return to ER for this.   Keep wound clean with mild soap and water. Keep area covered with a topical antibiotic ointment and bandage, keep bandage dry, and do not submerge in water for 24 hours. Ice and elevate for additional pain relief and swelling.  Monitor area for signs of infection to include, but not limited to: increasing pain, spreading redness, drainage/pus, worsening swelling, or fevers. Return to emergency department for emergent changing or worsening symptoms.

## 2017-11-07 NOTE — ED Notes (Signed)
Patient placed on Bair Hugger.

## 2017-11-25 ENCOUNTER — Emergency Department (HOSPITAL_COMMUNITY): Payer: Medicare Other

## 2017-11-25 ENCOUNTER — Inpatient Hospital Stay (HOSPITAL_COMMUNITY)
Admission: EM | Admit: 2017-11-25 | Discharge: 2017-12-03 | DRG: 871 | Disposition: E | Payer: Medicare Other | Attending: Pulmonary Disease | Admitting: Pulmonary Disease

## 2017-11-25 DIAGNOSIS — R6521 Severe sepsis with septic shock: Secondary | ICD-10-CM | POA: Diagnosis not present

## 2017-11-25 DIAGNOSIS — N179 Acute kidney failure, unspecified: Secondary | ICD-10-CM | POA: Diagnosis not present

## 2017-11-25 DIAGNOSIS — Z79899 Other long term (current) drug therapy: Secondary | ICD-10-CM | POA: Diagnosis not present

## 2017-11-25 DIAGNOSIS — N281 Cyst of kidney, acquired: Secondary | ICD-10-CM | POA: Diagnosis present

## 2017-11-25 DIAGNOSIS — D709 Neutropenia, unspecified: Secondary | ICD-10-CM | POA: Diagnosis present

## 2017-11-25 DIAGNOSIS — Z8673 Personal history of transient ischemic attack (TIA), and cerebral infarction without residual deficits: Secondary | ICD-10-CM

## 2017-11-25 DIAGNOSIS — E119 Type 2 diabetes mellitus without complications: Secondary | ICD-10-CM | POA: Diagnosis present

## 2017-11-25 DIAGNOSIS — E278 Other specified disorders of adrenal gland: Secondary | ICD-10-CM | POA: Diagnosis present

## 2017-11-25 DIAGNOSIS — R680 Hypothermia, not associated with low environmental temperature: Secondary | ICD-10-CM | POA: Diagnosis present

## 2017-11-25 DIAGNOSIS — K219 Gastro-esophageal reflux disease without esophagitis: Secondary | ICD-10-CM | POA: Diagnosis present

## 2017-11-25 DIAGNOSIS — E669 Obesity, unspecified: Secondary | ICD-10-CM | POA: Diagnosis present

## 2017-11-25 DIAGNOSIS — E785 Hyperlipidemia, unspecified: Secondary | ICD-10-CM | POA: Diagnosis present

## 2017-11-25 DIAGNOSIS — Z66 Do not resuscitate: Secondary | ICD-10-CM | POA: Diagnosis present

## 2017-11-25 DIAGNOSIS — Z952 Presence of prosthetic heart valve: Secondary | ICD-10-CM | POA: Diagnosis not present

## 2017-11-25 DIAGNOSIS — N183 Chronic kidney disease, stage 3 (moderate): Secondary | ICD-10-CM | POA: Diagnosis present

## 2017-11-25 DIAGNOSIS — Z7901 Long term (current) use of anticoagulants: Secondary | ICD-10-CM | POA: Diagnosis not present

## 2017-11-25 DIAGNOSIS — I959 Hypotension, unspecified: Secondary | ICD-10-CM | POA: Diagnosis present

## 2017-11-25 DIAGNOSIS — G9341 Metabolic encephalopathy: Secondary | ICD-10-CM | POA: Diagnosis present

## 2017-11-25 DIAGNOSIS — Z7984 Long term (current) use of oral hypoglycemic drugs: Secondary | ICD-10-CM | POA: Diagnosis not present

## 2017-11-25 DIAGNOSIS — E1122 Type 2 diabetes mellitus with diabetic chronic kidney disease: Secondary | ICD-10-CM | POA: Diagnosis present

## 2017-11-25 DIAGNOSIS — A419 Sepsis, unspecified organism: Principal | ICD-10-CM | POA: Diagnosis present

## 2017-11-25 DIAGNOSIS — R0902 Hypoxemia: Secondary | ICD-10-CM | POA: Diagnosis present

## 2017-11-25 DIAGNOSIS — I5032 Chronic diastolic (congestive) heart failure: Secondary | ICD-10-CM | POA: Diagnosis present

## 2017-11-25 DIAGNOSIS — R233 Spontaneous ecchymoses: Secondary | ICD-10-CM | POA: Diagnosis present

## 2017-11-25 DIAGNOSIS — L03115 Cellulitis of right lower limb: Secondary | ICD-10-CM | POA: Diagnosis present

## 2017-11-25 DIAGNOSIS — D72819 Decreased white blood cell count, unspecified: Secondary | ICD-10-CM | POA: Diagnosis present

## 2017-11-25 DIAGNOSIS — I4891 Unspecified atrial fibrillation: Secondary | ICD-10-CM | POA: Diagnosis present

## 2017-11-25 DIAGNOSIS — Z7952 Long term (current) use of systemic steroids: Secondary | ICD-10-CM

## 2017-11-25 DIAGNOSIS — Z452 Encounter for adjustment and management of vascular access device: Secondary | ICD-10-CM

## 2017-11-25 DIAGNOSIS — Z6833 Body mass index (BMI) 33.0-33.9, adult: Secondary | ICD-10-CM

## 2017-11-25 DIAGNOSIS — I130 Hypertensive heart and chronic kidney disease with heart failure and stage 1 through stage 4 chronic kidney disease, or unspecified chronic kidney disease: Secondary | ICD-10-CM | POA: Diagnosis present

## 2017-11-25 LAB — URINALYSIS, ROUTINE W REFLEX MICROSCOPIC
BILIRUBIN URINE: NEGATIVE
Glucose, UA: NEGATIVE mg/dL
HGB URINE DIPSTICK: NEGATIVE
KETONES UR: NEGATIVE mg/dL
Nitrite: NEGATIVE
Protein, ur: 30 mg/dL — AB
SQUAMOUS EPITHELIAL / LPF: NONE SEEN
Specific Gravity, Urine: 1.011 (ref 1.005–1.030)
pH: 5 (ref 5.0–8.0)

## 2017-11-25 LAB — GLUCOSE, CAPILLARY
GLUCOSE-CAPILLARY: 114 mg/dL — AB (ref 65–99)
Glucose-Capillary: 133 mg/dL — ABNORMAL HIGH (ref 65–99)

## 2017-11-25 LAB — COMPREHENSIVE METABOLIC PANEL
ALT: 15 U/L (ref 14–54)
AST: 23 U/L (ref 15–41)
Albumin: 2.8 g/dL — ABNORMAL LOW (ref 3.5–5.0)
Alkaline Phosphatase: 66 U/L (ref 38–126)
Anion gap: 10 (ref 5–15)
BILIRUBIN TOTAL: 0.8 mg/dL (ref 0.3–1.2)
BUN: 69 mg/dL — ABNORMAL HIGH (ref 6–20)
CHLORIDE: 100 mmol/L — AB (ref 101–111)
CO2: 25 mmol/L (ref 22–32)
CREATININE: 2.24 mg/dL — AB (ref 0.44–1.00)
Calcium: 9 mg/dL (ref 8.9–10.3)
GFR, EST AFRICAN AMERICAN: 21 mL/min — AB (ref >60)
GFR, EST NON AFRICAN AMERICAN: 18 mL/min — AB (ref >60)
Glucose, Bld: 141 mg/dL — ABNORMAL HIGH (ref 65–99)
Potassium: 3.6 mmol/L (ref 3.5–5.1)
Sodium: 135 mmol/L (ref 135–145)
TOTAL PROTEIN: 6.1 g/dL — AB (ref 6.5–8.1)

## 2017-11-25 LAB — I-STAT CHEM 8, ED
BUN: 74 mg/dL — AB (ref 6–20)
CALCIUM ION: 1.18 mmol/L (ref 1.15–1.40)
CREATININE: 2.1 mg/dL — AB (ref 0.44–1.00)
Chloride: 97 mmol/L — ABNORMAL LOW (ref 101–111)
Glucose, Bld: 131 mg/dL — ABNORMAL HIGH (ref 65–99)
HCT: 28 % — ABNORMAL LOW (ref 36.0–46.0)
Hemoglobin: 9.5 g/dL — ABNORMAL LOW (ref 12.0–15.0)
Potassium: 4 mmol/L (ref 3.5–5.1)
SODIUM: 137 mmol/L (ref 135–145)
TCO2: 29 mmol/L (ref 22–32)

## 2017-11-25 LAB — CBC WITH DIFFERENTIAL/PLATELET
BASOS ABS: 0 10*3/uL (ref 0.0–0.1)
Basophils Relative: 0 %
EOS ABS: 0 10*3/uL (ref 0.0–0.7)
Eosinophils Relative: 0 %
HEMATOCRIT: 25.1 % — AB (ref 36.0–46.0)
Hemoglobin: 8.4 g/dL — ABNORMAL LOW (ref 12.0–15.0)
LYMPHS ABS: 0.2 10*3/uL — AB (ref 0.7–4.0)
Lymphocytes Relative: 11 %
MCH: 32.3 pg (ref 26.0–34.0)
MCHC: 33.5 g/dL (ref 30.0–36.0)
MCV: 96.5 fL (ref 78.0–100.0)
MONO ABS: 0.1 10*3/uL (ref 0.1–1.0)
MONOS PCT: 7 %
Neutro Abs: 1.3 10*3/uL — ABNORMAL LOW (ref 1.7–7.7)
Neutrophils Relative %: 82 %
PLATELETS: 149 10*3/uL — AB (ref 150–400)
RBC: 2.6 MIL/uL — AB (ref 3.87–5.11)
RDW: 18.7 % — AB (ref 11.5–15.5)
WBC Morphology: INCREASED
WBC: 1.6 10*3/uL — AB (ref 4.0–10.5)

## 2017-11-25 LAB — CBG MONITORING, ED: GLUCOSE-CAPILLARY: 127 mg/dL — AB (ref 65–99)

## 2017-11-25 LAB — I-STAT TROPONIN, ED: Troponin i, poc: 0.01 ng/mL (ref 0.00–0.08)

## 2017-11-25 LAB — PHOSPHORUS: PHOSPHORUS: 4.3 mg/dL (ref 2.5–4.6)

## 2017-11-25 LAB — PROTIME-INR
INR: 2.09
PROTHROMBIN TIME: 23.3 s — AB (ref 11.4–15.2)

## 2017-11-25 LAB — I-STAT CG4 LACTIC ACID, ED: LACTIC ACID, VENOUS: 1.78 mmol/L (ref 0.5–1.9)

## 2017-11-25 LAB — MAGNESIUM: MAGNESIUM: 1.9 mg/dL (ref 1.7–2.4)

## 2017-11-25 MED ORDER — LORAZEPAM 2 MG/ML IJ SOLN
0.5000 mg | Freq: Once | INTRAMUSCULAR | Status: AC
  Administered 2017-11-25: 0.5 mg via INTRAVENOUS

## 2017-11-25 MED ORDER — GABAPENTIN 300 MG PO CAPS
600.0000 mg | ORAL_CAPSULE | Freq: Two times a day (BID) | ORAL | Status: DC
  Filled 2017-11-25: qty 2

## 2017-11-25 MED ORDER — NOREPINEPHRINE BITARTRATE 1 MG/ML IV SOLN
0.0000 ug/min | INTRAVENOUS | Status: DC
  Administered 2017-11-25: 10 ug/min via INTRAVENOUS
  Filled 2017-11-25 (×2): qty 4

## 2017-11-25 MED ORDER — PIPERACILLIN-TAZOBACTAM 3.375 G IVPB 30 MIN
3.3750 g | Freq: Once | INTRAVENOUS | Status: AC
  Administered 2017-11-25: 3.375 g via INTRAVENOUS
  Filled 2017-11-25: qty 50

## 2017-11-25 MED ORDER — SODIUM CHLORIDE 0.9 % IV BOLUS (SEPSIS)
1000.0000 mL | Freq: Once | INTRAVENOUS | Status: AC
  Administered 2017-11-25: 1000 mL via INTRAVENOUS

## 2017-11-25 MED ORDER — INSULIN ASPART 100 UNIT/ML ~~LOC~~ SOLN
2.0000 [IU] | SUBCUTANEOUS | Status: DC
Start: 2017-11-25 — End: 2017-11-26
  Administered 2017-11-26: 2 [IU] via SUBCUTANEOUS

## 2017-11-25 MED ORDER — SODIUM CHLORIDE 0.9 % IV SOLN
250.0000 mL | INTRAVENOUS | Status: DC | PRN
  Administered 2017-11-25: 250 mL via INTRAVENOUS

## 2017-11-25 MED ORDER — SODIUM CHLORIDE 0.9 % IV SOLN
INTRAVENOUS | Status: DC
  Administered 2017-11-25: 21:00:00 via INTRAVENOUS

## 2017-11-25 MED ORDER — PRAVASTATIN SODIUM 20 MG PO TABS
20.0000 mg | ORAL_TABLET | Freq: Every day | ORAL | Status: DC
  Filled 2017-11-25: qty 1

## 2017-11-25 MED ORDER — VANCOMYCIN HCL IN DEXTROSE 750-5 MG/150ML-% IV SOLN: 750.0000 mg | INTRAVENOUS | Status: DC

## 2017-11-25 MED ORDER — VANCOMYCIN HCL IN DEXTROSE 1-5 GM/200ML-% IV SOLN
1000.0000 mg | Freq: Once | INTRAVENOUS | Status: AC
  Administered 2017-11-25: 1000 mg via INTRAVENOUS
  Filled 2017-11-25: qty 200

## 2017-11-25 MED ORDER — PANTOPRAZOLE SODIUM 40 MG IV SOLR: 40.0000 mg | INTRAVENOUS | Status: DC

## 2017-11-25 MED ORDER — PIPERACILLIN-TAZOBACTAM 3.375 G IVPB
3.3750 g | Freq: Three times a day (TID) | INTRAVENOUS | Status: DC
  Administered 2017-11-25 – 2017-11-26 (×2): 3.375 g via INTRAVENOUS
  Filled 2017-11-25 (×3): qty 50

## 2017-11-25 MED ORDER — DEXTROSE 5 % IV SOLN
2.0000 g | Freq: Once | INTRAVENOUS | Status: AC
  Administered 2017-11-25: 2 g via INTRAVENOUS
  Filled 2017-11-25: qty 2

## 2017-11-25 MED ORDER — PANTOPRAZOLE SODIUM 40 MG IV SOLR
40.0000 mg | INTRAVENOUS | Status: DC
  Administered 2017-11-25: 40 mg via INTRAVENOUS
  Filled 2017-11-25: qty 40

## 2017-11-25 MED ORDER — HEPARIN SODIUM (PORCINE) 5000 UNIT/ML IJ SOLN
5000.0000 [IU] | Freq: Three times a day (TID) | INTRAMUSCULAR | Status: DC
  Administered 2017-11-25 – 2017-11-26 (×2): 5000 [IU] via SUBCUTANEOUS
  Filled 2017-11-25 (×2): qty 1

## 2017-11-25 MED ORDER — LORAZEPAM 2 MG/ML IJ SOLN
INTRAMUSCULAR | Status: AC
  Administered 2017-11-25: 0.5 mg via INTRAVENOUS
  Filled 2017-11-25: qty 1

## 2017-11-25 NOTE — Progress Notes (Signed)
Pharmacy Antibiotic Note  Kara Hammond is a 83 y.o. female admitted on 11/17/2017 with urosepsis, RLE cellulitis.  Pharmacy has been consulted for Vancomycin  Dosing.  Vancomycin 1 g IV given in ED at 1700  Plan: Vancomycin 750 mg IV q48h    Temp (24hrs), Avg:95 F (35 C), Min:94.2 F (34.6 C), Max:95.8 F (35.4 C)  Recent Labs  Lab 11/15/2017 1653 11/24/2017 1701 11/07/2017 1720  WBC 1.6*  --   --   CREATININE 2.24*  --  2.10*  LATICACIDVEN  --  1.78  --     CrCl cannot be calculated (Unknown ideal weight.).    No Known Allergies   Caryl Pina 11/08/2017 11:40 PM

## 2017-11-25 NOTE — Progress Notes (Signed)
Pharmacy Antibiotic Note  Kara Hammond is a 83 y.o. female admitted on 11/24/2017 with sepsis.  Pharmacy has been consulted for zosyn dosing.  Hypothermic on admit, wbc 1.6, scr elevated at 2.1.  Plan: Zosyn 3.375g IV q8h (4 hour infusion).     Temp (24hrs), Avg:94.2 F (34.6 C), Min:94.2 F (34.6 C), Max:94.2 F (34.6 C)  No results for input(s): WBC, CREATININE, LATICACIDVEN, VANCOTROUGH, VANCOPEAK, VANCORANDOM, GENTTROUGH, GENTPEAK, GENTRANDOM, TOBRATROUGH, TOBRAPEAK, TOBRARND, AMIKACINPEAK, AMIKACINTROU, AMIKACIN in the last 168 hours.  CrCl cannot be calculated (Patient's most recent lab result is older than the maximum 21 days allowed.).    No Known Allergies Thank you for allowing pharmacy to be a part of this patient's care.  Erin Hearing PharmD., BCPS Clinical Pharmacist Phone [phone] 12/01/2017 4:45 PM

## 2017-11-25 NOTE — ED Triage Notes (Signed)
PT arrived via [facility] from home with family. Per family, PT reported stomach pain, last seen normal at 2am, at 1am patient was seen "out of it" per family and they called 911. PT arrived with 18guage on hand. CBG 126 Patient is drowsy but arousable, states her name, stated that she is at "[facility]", denies stomach pain, denies any pain. Patient resting in the bed and follows command. Will continue to monitor

## 2017-11-25 NOTE — H&P (Signed)
PULMONARY / CRITICAL CARE MEDICINE   Name: Kara Hammond MRN: [id number] DOB: October 22, 1926    ADMISSION DATE:  11/27/2017 CONSULTATION DATE:  11/20/2017  REFERRING MD:  11/15/2017  CHIEF COMPLAINT:  Sepsis   HISTORY OF PRESENT ILLNESS:   83 year old female with PMH of A.Fib on Coumadin, TIA, Diastolic HF, HTN, HLD, DM, recent RLE cellulitis in July   Presents to ED on 12/24 with progressive lethargy and hypotension. Given 4L NS. LA 1.78. U/A with many bacteria. Given Zosyn and Vancomycin. Per family patient is a DNR/DNI. Okay with PIV Pressors and will discuss idea of CVC with family. PCCM asked to admit.   PAST MEDICAL HISTORY :  She  has a past medical history of CHF (congestive heart failure) ([place]) and Diabetes mellitus without complication ([place]).  PAST SURGICAL HISTORY: She  has a past surgical history that includes Cardiac surgery; Hip surgery; and Hernia repair.  No Known Allergies  No current facility-administered medications on file prior to encounter.    Current Outpatient Medications on File Prior to Encounter  Medication Sig  . acarbose (PRECOSE) 50 MG tablet Take 50 mg by mouth 3 (three) times daily.  Marland Kitchen acetaminophen (TYLENOL) 500 MG tablet Take 500-1,000 mg by mouth every 6 (six) hours as needed for mild pain or headache (sleep).   Marland Kitchen allopurinol (ZYLOPRIM) 100 MG tablet Take 200 mg by mouth daily.  . carvedilol (COREG) 3.125 MG tablet Take 9.375 mg by mouth 2 (two) times daily.   . furosemide (LASIX) 40 MG tablet Take 40 mg by mouth 3 (three) times daily.  Marland Kitchen gabapentin (NEURONTIN) 600 MG tablet Take 600 mg by mouth 2 (two) times daily.  Marland Kitchen glimepiride (AMARYL) 4 MG tablet Take 4 mg by mouth daily.  . Multiple Vitamin (MULTIVITAMIN WITH MINERALS) TABS tablet Take 1 tablet by mouth daily.  Marland Kitchen neomycin-polymyxin b-dexamethasone (MAXITROL) 3.5-10000-0.1 SUSP Place 1 drop into both eyes 4 (four) times daily.  . pantoprazole (PROTONIX) 40 MG tablet Take 40 mg by mouth daily.   . potassium chloride (K-DUR,KLOR-CON) 10 MEQ tablet Take 10 mEq by mouth daily.  . pravastatin (PRAVACHOL) 20 MG tablet Take 20 mg by mouth daily.  Marland Kitchen triamcinolone cream (KENALOG) 0.1 % Apply 1 application topically 2 (two) times daily as needed (rash/itching).   . vitamin B-12 (CYANOCOBALAMIN) 1000 MCG tablet Take 1,000 mcg by mouth daily.  Marland Kitchen warfarin (COUMADIN) 2 MG tablet Take 2 mg by mouth See admin instructions. Take 1 tablet (2 mg) by mouth every morning until recheck on 11/27/17    FAMILY HISTORY:  Her indicated that her mother is deceased. She indicated that her father is deceased.   SOCIAL HISTORY: She  reports that  has never smoked. she has never used smokeless tobacco. She reports that she does not drink alcohol or use drugs.  REVIEW OF SYSTEMS:   Unable to review as patient is encephalopathic   SUBJECTIVE:   VITAL SIGNS: BP (!) 82/49   Pulse 68   Temp (!) 94.2 F (34.6 C) (Rectal)   Resp 14   SpO2 92%   HEMODYNAMICS:    VENTILATOR SETTINGS:    INTAKE / OUTPUT: I/O last 3 completed shifts: In: 2250 [IV Piggyback:2250] Out: -   PHYSICAL EXAMINATION: General:  Elderly female, no distress  Neuro:  Confused, alert, pupils intact and reactive, moves extremities  HEENT:  Dry MM  Cardiovascular:  Irregular, rate controlled, no MRG  Lungs:  Clear breath sounds, no wheeze/crackles  Abdomen:  Obese,  active bowel sounds  Musculoskeletal:  +4 edema to BLE, redness noted to RLE  Skin:  Cool, dry   LABS:  BMET Recent Labs  Lab 11/29/2017 1653 12/01/2017 1720  NA 135 137  K 3.6 4.0  CL 100* 97*  CO2 25  --   BUN 69* 74*  CREATININE 2.24* 2.10*  GLUCOSE 141* 131*    Electrolytes Recent Labs  Lab 11/04/2017 1653  CALCIUM 9.0    CBC Recent Labs  Lab 11/30/2017 1653 11/06/2017 1720  WBC 1.6*  --   HGB 8.4* 9.5*  HCT 25.1* 28.0*  PLT 149*  --     Coag's Recent Labs  Lab 11/07/2017 1653  INR 2.09    Sepsis Markers Recent Labs  Lab 11/23/2017 1701   LATICACIDVEN 1.78    ABG No results for input(s): PHART, PCO2ART, PO2ART in the last 168 hours.  Liver Enzymes Recent Labs  Lab 11/14/2017 1653  AST 23  ALT 15  ALKPHOS 66  BILITOT 0.8  ALBUMIN 2.8*    Cardiac Enzymes No results for input(s): TROPONINI, PROBNP in the last 168 hours.  Glucose Recent Labs  Lab 11/12/2017 1549  GLUCAP 127*    Imaging Ct Abdomen Pelvis Wo Contrast  Result Date: 11/09/2017 CLINICAL DATA:  Abdominal pain and fever.  Suspect abscess. EXAM: CT ABDOMEN AND PELVIS WITHOUT CONTRAST TECHNIQUE: Multidetector CT imaging of the abdomen and pelvis was performed following the standard protocol without IV contrast. COMPARISON:  None. FINDINGS: Lower chest: Cardiomegaly. Calcification over the mitral valve annulus. Small bilateral pleural effusions are present. There is mild opacification over the left lower lobe likely atelectasis although infection is possible. Hepatobiliary: Minimal cholelithiasis with mild gallbladder distention. Small amount of perihepatic and pericholecystic fluid. Liver and biliary tree otherwise unremarkable. Pancreas: Within normal. Spleen: Small amount of perisplenic fluid as the spleen is otherwise within normal. Adrenals/Urinary Tract: Indeterminate 2.4 cm left adrenal nodule likely an adenoma. Right adrenal gland is within normal. Kidneys are normal in size without hydronephrosis or nephrolithiasis. There are bilateral renal cysts. Subtle thin wall calcification along a cyst over the mid pole left kidney. Visualize ureters are within normal. The distal ureters are obscured by moderate streak artifact from right hip prosthesis. Visualized portions of the bladder are unremarkable although partially obscured by streak artifact. Stomach/Bowel: Stomach and small bowel are within normal. Moderate fecal retention over the rectum. Moderate diverticulosis over the descending colon and right colon. Appendix is not well visualized. Vascular/Lymphatic:  Moderate calcified plaque over the abdominal aorta and iliac arteries. No significant adenopathy. Reproductive: Not well visualized due to streak artifact from the right hip prosthesis. Other: Small amount of free fluid in the root presacral space. Mild diffuse subcutaneous edema. Musculoskeletal: Curvature of the thoracolumbar spine convex right with moderate spondylosis throughout the spine. Grade 1-2 anterolisthesis of L5 on S1. IMPRESSION: No acute findings in the abdomen/ pelvis. Small bilateral pleural effusions with left basilar opacification likely atelectasis although early infection is possible. Mild cholelithiasis.  Minimal ascites. Colonic diverticulosis.  Moderate fecal retention over the rectum. Bilateral renal cysts. Mild cardiomegaly. Aortic Atherosclerosis (ICD10-I70.0). 2.4 cm left adrenal mass likely an adenoma. Electronically Signed   By: Marin Olp M.D.   On: 11/20/2017 18:30   Dg Chest Portable 1 View  Result Date: 11/20/2017 CLINICAL DATA:  Altered mental status. EXAM: PORTABLE CHEST 1 VIEW COMPARISON:  Radiograph June 15, 2017. FINDINGS: Stable cardiomegaly with central pulmonary vascular congestion. Atherosclerosis of thoracic aorta is noted. Stable reticulonodular densities are  noted throughout both lungs which may represent chronic interstitial lung disease or scarring, but acute superimposed edema or inflammation cannot be excluded. Mild left basilar atelectasis is noted with mild left pleural effusion. Bony thorax is unremarkable. No pneumothorax is noted. IMPRESSION: Aortic atherosclerosis. Stable cardiomegaly with central pulmonary vascular congestion. Stable reticulonodular densities are noted throughout both lungs which may represent chronic interstitial lung disease or scarring, but superimposed acute edema or inflammation cannot be excluded. Mild left basilar atelectasis with mild left pleural effusion. Electronically Signed   By: Marijo Conception, M.D.   On: 11/29/2017  16:33     STUDIES:  ECHO 06/17/2017 > EF 84-13, Systolic Function normal  CXR 11/23/2017 > Aortic atherosclerosis. Stable cardiomegaly with central pulmonary vascular congestion. Stable reticulonodular densities are noted throughout both lungs which may represent chronic interstitial lung disease or scarring, but superimposed acute edema or inflammation cannot be excluded. Mild left basilar atelectasis with mild left pleural effusion CT AP 12/24 > No acute findings in the abdomen/ pelvis. Small bilateral pleural effusions with left basilar opacification likely atelectasis although early infection is possible. Mild cholelithiasis.  Minimal ascites. Colonic diverticulosis.  Moderate fecal retention over the rectum. Bilateral renal cysts.Mild cardiomegaly.Aortic Atherosclerosis (ICD10-I70.0). 2.4 cm left adrenal mass likely an adenoma.  CULTURES: U/A 12/24 > Many Bacteria, moderate leukocytes Urine Culture 12/24 >>  Blood 12/24 >>    ANTIBIOTICS: Vancomycin 12/24  Zosyn 12/24 >>  Rocephin 12/24   SIGNIFICANT EVENTS: 12/24 > Presents to ED   LINES/TUBES: PIV   DISCUSSION: 83 year old female presents to ED AMS, found to be uroseptic. Full DNR   ASSESSMENT / PLAN:  PULMONARY A: Tachypnea in setting of sepsis  CT with bilateral pleural effusions and left basilar opacification  P:   Maintain Oxygen Saturation >92 Trend CXR   CARDIOVASCULAR A:  Hypotensive in setting of sepsis s/p 4 L NS  A.Fib with Mitral Valve Replacement on Coumadin  Diastolic HF (EF 24-40)  H/O HTN, HLD P:  Cardiac Monitoring  Wean Levophed to maintain MAP >65 (Family unsure if they would want CVC placed) okay with PIV Pressors if requirements increase will readdress, currently on 8 mcg/min  ECHO pending  Hold Coreg, Lasix in setting of hypotension  Continue home Pravastatin  Coumadin hold > Repeat INR in AM, if less than 2 will start anticoagulation     RENAL A:   CKD stage 3  Recent Crt  1.8-2.3 P:   Trend BMP Replace electrolytes as indicated   GASTROINTESTINAL A:   GERD P:   NPO PPI  HEMATOLOGIC A:   Suspected Reactive Leukopenia/Neutropenia  Chronic Anticoagulation  P:  Trend CBC   INFECTIOUS A:   Suspected Urosepsis  U/A with many bacteria  CT A/P > Bilateral renal cysts, 2.4 cm left adrenal mass Hypothermic   H/O RLE Cellulitis (RLE red and warm) recent treatment July  LA 1.78  P:   Trend WBC and Fever Curve Follow Culture Data  Trend PCT  Continue Zosyn and Vancomycin (given concern for continued cellulitis) LE Dopplers    ENDOCRINE A:   DM   P:   Trend Glucose SSI   NEUROLOGIC A:   Metabolic Encephalopathy  H/O TIA  P:   Monitor  Avoid Sedative Medications    FAMILY  - Updates: Family updated at bedside. States patient is a full DNR. Would like to speak with other family members before proceeding with CVC placement.   - Inter-disciplinary family meet or Palliative Care  meeting due by: 12/02/2017    CC Time: 6 minutes  Hayden Pedro, AGACNP-BC [place] [facility]  Pgr: [phone]  PCCM Pgr: [phone]

## 2017-11-26 ENCOUNTER — Encounter (HOSPITAL_COMMUNITY): Payer: Medicare Other

## 2017-11-26 ENCOUNTER — Inpatient Hospital Stay (HOSPITAL_COMMUNITY): Payer: Medicare Other

## 2017-11-26 DIAGNOSIS — N179 Acute kidney failure, unspecified: Secondary | ICD-10-CM

## 2017-11-26 LAB — BASIC METABOLIC PANEL
ANION GAP: 11 (ref 5–15)
BUN: 66 mg/dL — ABNORMAL HIGH (ref 6–20)
CHLORIDE: 102 mmol/L (ref 101–111)
CO2: 22 mmol/L (ref 22–32)
Calcium: 8.4 mg/dL — ABNORMAL LOW (ref 8.9–10.3)
Creatinine, Ser: 2.11 mg/dL — ABNORMAL HIGH (ref 0.44–1.00)
GFR calc Af Amer: 23 mL/min — ABNORMAL LOW (ref >60)
GFR calc non Af Amer: 20 mL/min — ABNORMAL LOW (ref >60)
GLUCOSE: 107 mg/dL — AB (ref 65–99)
POTASSIUM: 3.2 mmol/L — AB (ref 3.5–5.1)
Sodium: 135 mmol/L (ref 135–145)

## 2017-11-26 LAB — CBC WITH DIFFERENTIAL/PLATELET
BASOS PCT: 0 %
Basophils Absolute: 0 10*3/uL (ref 0.0–0.1)
EOS ABS: 0 10*3/uL (ref 0.0–0.7)
Eosinophils Relative: 0 %
HCT: 28.2 % — ABNORMAL LOW (ref 36.0–46.0)
Hemoglobin: 9.3 g/dL — ABNORMAL LOW (ref 12.0–15.0)
LYMPHS ABS: 0.3 10*3/uL — AB (ref 0.7–4.0)
Lymphocytes Relative: 9 %
MCH: 32.2 pg (ref 26.0–34.0)
MCHC: 33 g/dL (ref 30.0–36.0)
MCV: 97.6 fL (ref 78.0–100.0)
MONO ABS: 0.3 10*3/uL (ref 0.1–1.0)
Monocytes Relative: 10 %
NEUTROS ABS: 2.5 10*3/uL (ref 1.7–7.7)
Neutrophils Relative %: 81 %
PLATELETS: 215 10*3/uL (ref 150–400)
RBC: 2.89 MIL/uL — ABNORMAL LOW (ref 3.87–5.11)
RDW: 19 % — AB (ref 11.5–15.5)
WBC Morphology: INCREASED
WBC: 3.1 10*3/uL — ABNORMAL LOW (ref 4.0–10.5)

## 2017-11-26 LAB — GLUCOSE, CAPILLARY: GLUCOSE-CAPILLARY: 104 mg/dL — AB (ref 65–99)

## 2017-11-26 LAB — PROCALCITONIN
PROCALCITONIN: 0.46 ng/mL
Procalcitonin: 2.6 ng/mL

## 2017-11-26 LAB — PHOSPHORUS: PHOSPHORUS: 4.1 mg/dL (ref 2.5–4.6)

## 2017-11-26 LAB — MAGNESIUM: Magnesium: 1.8 mg/dL (ref 1.7–2.4)

## 2017-11-26 MED ORDER — PHENYLEPHRINE 40 MCG/ML (10ML) SYRINGE FOR IV PUSH (FOR BLOOD PRESSURE SUPPORT)
200.0000 ug | PREFILLED_SYRINGE | Freq: Once | INTRAVENOUS | Status: AC
  Administered 2017-11-26: 200 ug via INTRAVENOUS
  Filled 2017-11-26: qty 5

## 2017-11-26 MED ORDER — VASOPRESSIN 20 UNIT/ML IV SOLN
0.0300 [IU]/min | INTRAVENOUS | Status: DC
  Administered 2017-11-26: 0.03 [IU]/min via INTRAVENOUS
  Filled 2017-11-26: qty 2

## 2017-11-26 MED ORDER — SODIUM CHLORIDE 0.9 % IV BOLUS (SEPSIS)
250.0000 mL | Freq: Once | INTRAVENOUS | Status: AC
  Administered 2017-11-26: 250 mL via INTRAVENOUS

## 2017-11-26 MED ORDER — DEXTROSE 5 % IV SOLN
0.0000 ug/min | INTRAVENOUS | Status: DC
  Administered 2017-11-26: 38 ug/min via INTRAVENOUS
  Filled 2017-11-26: qty 16

## 2017-11-27 LAB — URINE CULTURE

## 2017-11-30 LAB — CULTURE, BLOOD (ROUTINE X 2)
CULTURE: NO GROWTH
Culture: NO GROWTH
Special Requests: ADEQUATE
Special Requests: ADEQUATE

## 2017-12-03 NOTE — Progress Notes (Signed)
PCCM Interval Note  Patient increase in levophed requirements. Spoke with family about CVC placement. Daughters agree on Veterinary surgeon.   Hayden Pedro, AGACNP-BC [facility]  Pgr: [phone]  PCCM Pgr: [phone]

## 2017-12-03 NOTE — Procedures (Signed)
Central Venous Catheter Insertion Procedure Note Kara Hammond [id number] 09/04/1926  Procedure: Insertion of Central Venous Catheter Indications: Assessment of intravascular volume, Drug and/or fluid administration and Frequent blood sampling  Procedure Details Consent: Risks of procedure as well as the alternatives and risks of each were explained to the (patient/caregiver).  Consent for procedure obtained. Time Out: Verified patient identification, verified procedure, site/side was marked, verified correct patient position, special equipment/implants available, medications/allergies/relevent history reviewed, required imaging and test results available.  Performed  Maximum sterile technique was used including antiseptics, cap, gloves, gown, hand hygiene, mask and sheet. Skin prep: Chlorhexidine; local anesthetic administered A antimicrobial bonded/coated triple lumen catheter was placed in the right internal jugular vein using the Seldinger technique.  Evaluation Blood flow good Complications: No apparent complications Patient did tolerate procedure well. Chest X-ray ordered to verify placement.  CXR: pending.  Hayden Pedro, AGACNP-BC [facility]  Pgr: [phone]  PCCM Pgr: [phone]

## 2017-12-03 NOTE — Progress Notes (Signed)
PCCM Interval Note  Patient max dose of Levophed and Vasopressin. Now hypoxic requiring NRB. Family called to bedside to discuss severity of condition. Family understands and would not want patient to suffer. Waiting for one daughter to come in to make further decisions. For now will not further escalate care.  Kara Hammond, AGACNP-BC [facility]  Pgr: [phone]  PCCM Pgr: [phone]

## 2017-12-03 NOTE — Progress Notes (Signed)
Daughters at bs at 0800 when patient with agonal breathing ensuing. Patient deceased at 0806, with absence of breathing or heartbeat witnessed by Antoine Primas and DeDe RN. Dr. Rolla Etienne on unit and informed. Comfort provided.

## 2017-12-03 NOTE — ED Provider Notes (Signed)
[place] [facility] MEDICAL ICU Provider Note   CSN: [id number] Arrival date & time: 11/11/2017  1546     History   Chief Complaint No chief complaint on file.   HPI Kara Hammond is a 84 y.o. female. Chief complaints abdominal pain, confusion.  HPI patient arrives via EMS with [facility]. Apparently at 2 AM complaining of abdominal pain. Was lethargic at home and thus they present her emergency room for evaluation.  Hypothermic here. No fall or injury. No change in medications. No vomiting. No dysuria. Patient with eyes closed. Will answer some simple questioning.  Family arrives after the patient has been here several hours. Ultimately they decided they want her to be DO NOT RESUSCITATE but want aggressive noninvasive treatment.  Past Medical History:  Diagnosis Date  . CHF (congestive heart failure) ([place])   . Diabetes mellitus without complication [facility])     Patient Active Problem List   Diagnosis Date Noted  . Sepsis ([place]) 11/22/2017  . Weakness   . Altered mental status 06/15/2017  . Cellulitis 06/15/2017    Past Surgical History:  Procedure Laterality Date  . CARDIAC SURGERY    . HERNIA REPAIR    . HIP SURGERY      OB History    No data available       Home Medications    Prior to Admission medications   Medication Sig Start Date End Date Taking? Authorizing Provider  acarbose (PRECOSE) 50 MG tablet Take 50 mg by mouth 3 (three) times daily. 05/19/17  Yes [provider]  acetaminophen (TYLENOL) 500 MG tablet Take 500-1,000 mg by mouth every 6 (six) hours as needed for mild pain or headache (sleep).    Yes [provider]  allopurinol (ZYLOPRIM) 100 MG tablet Take 200 mg by mouth daily. 04/27/17  Yes [provider]  carvedilol (COREG) 3.125 MG tablet Take 9.375 mg by mouth 2 (two) times daily.  03/18/17  Yes [provider]  furosemide (LASIX) 40 MG tablet Take 40 mg by mouth 3 (three) times daily.  03/25/17  Yes [provider]  gabapentin (NEURONTIN) 600 MG tablet Take 600 mg by mouth 2 (two) times daily. 04/26/17  Yes [provider]  glimepiride (AMARYL) 4 MG tablet Take 4 mg by mouth daily. 10/09/17  Yes [provider]  Multiple Vitamin (MULTIVITAMIN WITH MINERALS) TABS tablet Take 1 tablet by mouth daily.   Yes [provider]  neomycin-polymyxin b-dexamethasone (MAXITROL) 3.5-10000-0.1 SUSP Place 1 drop into both eyes 4 (four) times daily. 11/12/17  Yes [provider]  pantoprazole (PROTONIX) 40 MG tablet Take 40 mg by mouth daily. 04/20/17  Yes [provider]  potassium chloride (K-DUR,KLOR-CON) 10 MEQ tablet Take 10 mEq by mouth daily.   Yes [provider]  pravastatin (PRAVACHOL) 20 MG tablet Take 20 mg by mouth daily. 04/06/17  Yes [provider]  triamcinolone cream (KENALOG) 0.1 % Apply 1 application topically 2 (two) times daily as needed (rash/itching).  11/14/17  Yes [provider]  vitamin B-12 (CYANOCOBALAMIN) 1000 MCG tablet Take 1,000 mcg by mouth daily.   Yes [provider]  warfarin (COUMADIN) 2 MG tablet Take 2 mg by mouth See admin instructions. Take 1 tablet (2 mg) by mouth every morning until recheck on 11/27/17 06/01/17  Yes [provider]    Family History No family history on file.  Social History Social History   Tobacco Use  . Smoking status: Never Smoker  .  Smokeless tobacco: Never Used  Substance Use Topics  . Alcohol use: No    Frequency: Never  . Drug use: No     Allergies   Patient has no known allergies.   Review of Systems Review of Systems  Constitutional: Negative for appetite change, chills, diaphoresis, fatigue and fever.  HENT: Negative for mouth sores, sore throat and trouble swallowing.   Eyes: Negative for visual disturbance.  Respiratory: Negative for cough, chest tightness, shortness of breath and wheezing.   Cardiovascular:  Negative for chest pain.  Gastrointestinal: Positive for abdominal pain. Negative for abdominal distention, diarrhea, nausea and vomiting.  Endocrine: Negative for polydipsia, polyphagia and polyuria.  Genitourinary: Negative for dysuria, frequency and hematuria.  Musculoskeletal: Negative for gait problem.  Skin: Negative for color change, pallor and rash.  Neurological: Negative for dizziness, syncope, light-headedness and headaches.  Hematological: Does not bruise/bleed easily.  Psychiatric/Behavioral: Positive for confusion. Negative for behavioral problems.     Physical Exam Updated Vital Signs BP (!) 71/46   Pulse (!) 118   Temp (!) 97.3 F (36.3 C) (Axillary) Comment: RN Ivin Booty notified Comment (Src): wrong documentation  Resp 20   Wt 89.7 kg (197 lb 12 oz)   SpO2 99%   BMI 33.94 kg/m   Physical Exam  Constitutional: She is oriented to person, place, and time. She appears well-developed and well-nourished. No distress.  Eyes closed. He'll answer some simple questioning. Can tell me she is in the emergency room.  HENT:  Head: Normocephalic.  Eyes: Conjunctivae are normal. Pupils are equal, round, and reactive to light. No scleral icterus.  Neck: Normal range of motion. Neck supple. No thyromegaly present.  Cardiovascular: Normal rate and regular rhythm. Exam reveals no gallop and no friction rub.  No murmur heard. Pulmonary/Chest: Effort normal and breath sounds normal. No respiratory distress. She has no wheezes. She has no rales.  Abdominal: Soft. Bowel sounds are normal. She exhibits no distension. There is no tenderness. There is no rebound.  Soft benign abdomen.  Musculoskeletal: Normal range of motion.  Neurological: She is alert and oriented to person, place, and time.  Skin: Skin is warm and dry. No rash noted.  Psychiatric: She has a normal mood and affect. Her behavior is normal.     ED Treatments / Results  Labs (all labs ordered are listed, but only  abnormal results are displayed) Labs Reviewed  URINE CULTURE - Abnormal; Notable for the following components:      Result Value   Culture >=100,000 COLONIES/mL GRAM NEGATIVE RODS (*)    All other components within normal limits  COMPREHENSIVE METABOLIC PANEL - Abnormal; Notable for the following components:   Chloride 100 (*)    Glucose, Bld 141 (*)    BUN 69 (*)    Creatinine, Ser 2.24 (*)    Total Protein 6.1 (*)    Albumin 2.8 (*)    GFR calc non Af Amer 18 (*)    GFR calc Af Amer 21 (*)    All other components within normal limits  CBC WITH DIFFERENTIAL/PLATELET - Abnormal; Notable for the following components:   WBC 1.6 (*)    RBC 2.60 (*)    Hemoglobin 8.4 (*)    HCT 25.1 (*)    RDW 18.7 (*)    Platelets 149 (*)    Neutro Abs 1.3 (*)    Lymphs Abs 0.2 (*)    All other components within normal limits  PROTIME-INR - Abnormal; Notable for the following  components:   Prothrombin Time 23.3 (*)    All other components within normal limits  URINALYSIS, ROUTINE W REFLEX MICROSCOPIC - Abnormal; Notable for the following components:   APPearance HAZY (*)    Protein, ur 30 (*)    Leukocytes, UA MODERATE (*)    Bacteria, UA MANY (*)    All other components within normal limits  BASIC METABOLIC PANEL - Abnormal; Notable for the following components:   Potassium 3.2 (*)    Glucose, Bld 107 (*)    BUN 66 (*)    Creatinine, Ser 2.11 (*)    Calcium 8.4 (*)    GFR calc non Af Amer 20 (*)    GFR calc Af Amer 23 (*)    All other components within normal limits  GLUCOSE, CAPILLARY - Abnormal; Notable for the following components:   Glucose-Capillary 114 (*)    All other components within normal limits  CBC WITH DIFFERENTIAL/PLATELET - Abnormal; Notable for the following components:   WBC 3.1 (*)    RBC 2.89 (*)    Hemoglobin 9.3 (*)    HCT 28.2 (*)    RDW 19.0 (*)    Lymphs Abs 0.3 (*)    All other components within normal limits  GLUCOSE, CAPILLARY - Abnormal; Notable for  the following components:   Glucose-Capillary 133 (*)    All other components within normal limits  GLUCOSE, CAPILLARY - Abnormal; Notable for the following components:   Glucose-Capillary 104 (*)    All other components within normal limits  CBG MONITORING, ED - Abnormal; Notable for the following components:   Glucose-Capillary 127 (*)    All other components within normal limits  I-STAT CHEM 8, ED - Abnormal; Notable for the following components:   Chloride 97 (*)    BUN 74 (*)    Creatinine, Ser 2.10 (*)    Glucose, Bld 131 (*)    Hemoglobin 9.5 (*)    HCT 28.0 (*)    All other components within normal limits  CULTURE, BLOOD (ROUTINE X 2)  CULTURE, BLOOD (ROUTINE X 2)  PROCALCITONIN  PROCALCITONIN  MAGNESIUM  PHOSPHORUS  MAGNESIUM  PHOSPHORUS  I-STAT CG4 LACTIC ACID, ED  I-STAT TROPONIN, ED    EKG  EKG Interpretation  Date/Time:  Monday November 25 2017 19:13:01 EST Ventricular Rate:  69 PR Interval:    QRS Duration: 134 QT Interval:  462 QTC Calculation: 495 R Axis:   90 Text Interpretation:  Undetermined rhythm Nonspecific intraventricular conduction delay Inferior infarct, age indeterminate Lateral leads are also involved Artifact in lead(s) I II aVR aVL aVF V1 V2 V4 Confirmed by Tanna Furry [phone]) on 11/12/2017 7:21:41 PM Also confirmed by Tanna Furry [phone]), editor Laurena Spies [phone])  on December 07, 2017 8:15:35 AM       Radiology Ct Abdomen Pelvis Wo Contrast  Result Date: 11/07/2017 CLINICAL DATA:  Abdominal pain and fever.  Suspect abscess. EXAM: CT ABDOMEN AND PELVIS WITHOUT CONTRAST TECHNIQUE: Multidetector CT imaging of the abdomen and pelvis was performed following the standard protocol without IV contrast. COMPARISON:  None. FINDINGS: Lower chest: Cardiomegaly. Calcification over the mitral valve annulus. Small bilateral pleural effusions are present. There is mild opacification over the left lower lobe likely atelectasis although infection is  possible. Hepatobiliary: Minimal cholelithiasis with mild gallbladder distention. Small amount of perihepatic and pericholecystic fluid. Liver and biliary tree otherwise unremarkable. Pancreas: Within normal. Spleen: Small amount of perisplenic fluid as the spleen is otherwise within normal. Adrenals/Urinary Tract: Indeterminate 2.4 cm  left adrenal nodule likely an adenoma. Right adrenal gland is within normal. Kidneys are normal in size without hydronephrosis or nephrolithiasis. There are bilateral renal cysts. Subtle thin wall calcification along a cyst over the mid pole left kidney. Visualize ureters are within normal. The distal ureters are obscured by moderate streak artifact from right hip prosthesis. Visualized portions of the bladder are unremarkable although partially obscured by streak artifact. Stomach/Bowel: Stomach and small bowel are within normal. Moderate fecal retention over the rectum. Moderate diverticulosis over the descending colon and right colon. Appendix is not well visualized. Vascular/Lymphatic: Moderate calcified plaque over the abdominal aorta and iliac arteries. No significant adenopathy. Reproductive: Not well visualized due to streak artifact from the right hip prosthesis. Other: Small amount of free fluid in the root presacral space. Mild diffuse subcutaneous edema. Musculoskeletal: Curvature of the thoracolumbar spine convex right with moderate spondylosis throughout the spine. Grade 1-2 anterolisthesis of L5 on S1. IMPRESSION: No acute findings in the abdomen/ pelvis. Small bilateral pleural effusions with left basilar opacification likely atelectasis although early infection is possible. Mild cholelithiasis.  Minimal ascites. Colonic diverticulosis.  Moderate fecal retention over the rectum. Bilateral renal cysts. Mild cardiomegaly. Aortic Atherosclerosis (ICD10-I70.0). 2.4 cm left adrenal mass likely an adenoma. Electronically Signed   By: Marin Olp M.D.   On: 11/03/2017 18:30    Dg Chest Port 1 View  Result Date: 12-24-17 CLINICAL DATA:  84 year old female with central line placement. EXAM: PORTABLE CHEST 1 VIEW COMPARISON:  Chest radiograph dated 11/24/2017 FINDINGS: Interval placement of a right IJ central line with tip in the region of the cavoatrial junction. No pneumothorax. Stable cardiomegaly. No interval change in the appearance of the lungs and left lung base density compared to the earlier radiograph. Osteopenia with degenerative changes of the spine. No acute osseous pathology. IMPRESSION: Interval placement of a right IJ central line with tip in the region of the cavoatrial junction. No pneumothorax. No interval change in the appearance of the lungs compared to the earlier radiograph. Electronically Signed   By: Anner Crete M.D.   On: 2017/12/24 02:09   Dg Chest Portable 1 View  Result Date: 11/03/2017 CLINICAL DATA:  Altered mental status. EXAM: PORTABLE CHEST 1 VIEW COMPARISON:  Radiograph June 15, 2017. FINDINGS: Stable cardiomegaly with central pulmonary vascular congestion. Atherosclerosis of thoracic aorta is noted. Stable reticulonodular densities are noted throughout both lungs which may represent chronic interstitial lung disease or scarring, but acute superimposed edema or inflammation cannot be excluded. Mild left basilar atelectasis is noted with mild left pleural effusion. Bony thorax is unremarkable. No pneumothorax is noted. IMPRESSION: Aortic atherosclerosis. Stable cardiomegaly with central pulmonary vascular congestion. Stable reticulonodular densities are noted throughout both lungs which may represent chronic interstitial lung disease or scarring, but superimposed acute edema or inflammation cannot be excluded. Mild left basilar atelectasis with mild left pleural effusion. Electronically Signed   By: Marijo Conception, M.D.   On: 11/17/2017 16:33    Procedures Procedures (including critical care time)  Medications Ordered in  ED Medications  sodium chloride 0.9 % bolus 1,000 mL (0 mLs Intravenous Stopped 11/15/2017 1721)    And  sodium chloride 0.9 % bolus 1,000 mL (0 mLs Intravenous Stopped 11/18/2017 1730)    And  sodium chloride 0.9 % bolus 1,000 mL (0 mLs Intravenous Stopped 11/28/2017 1919)  piperacillin-tazobactam (ZOSYN) IVPB 3.375 g (0 g Intravenous Stopped 11/07/2017 1723)  vancomycin (VANCOCIN) IVPB 1000 mg/200 mL premix (0 mg Intravenous Stopped 11/27/2017 1753)  cefTRIAXone (ROCEPHIN) 2 g in dextrose 5 % 50 mL IVPB (0 g Intravenous Stopped 11/16/2017 1919)  sodium chloride 0.9 % bolus 1,000 mL (0 mLs Intravenous Stopped 11/02/2017 2003)  LORazepam (ATIVAN) injection 0.5 mg (0.5 mg Intravenous Given 11/20/2017 2318)  PHENYLephrine 40 mcg/ml in normal saline Adult IV Push Syringe (200 mcg Intravenous Given by Other 11/30/2017 0054)  sodium chloride 0.9 % bolus 250 mL (0 mLs Intravenous Stopped 11/30/17 0053)     Initial Impression / Assessment and Plan / ED Course  I have reviewed the triage vital signs and the nursing notes.  Pertinent labs & imaging results that were available during my care of the patient were reviewed by me and considered in my medical decision making (see chart for details).     CT shows no acute processes. Patient is hypothermic this code sepsis initiated. Given sepsis fluids plus an additional liter. Chest x-ray shows no acute abnormality. Urine appears to be source of infection. Covered with aggressive antibodies via sepsis. Upon urine results was given Rocephin. Discussed with critical care. Multiple discussions with family. Has increasing O2 requirements. Initially declined central line placement. Started on low-dose peripheral pressors. We'll be admitted to the ICU.  CRITICAL CARE Performed by: Lolita Patella   Total critical care time: 30 minutes  Critical care time was exclusive of separately billable procedures and treating other patients.  Critical care was necessary to treat  or prevent imminent or life-threatening deterioration.  Critical care was time spent personally by me on the following activities: development of treatment plan with patient and/or surrogate as well as nursing, discussions with consultants, evaluation of patient's response to treatment, examination of patient, obtaining history from patient or surrogate, ordering and performing treatments and interventions, ordering and review of laboratory studies, ordering and review of radiographic studies, pulse oximetry and re-evaluation of patient's condition.   Final Clinical Impressions(s) / ED Diagnoses   Final diagnoses:  Encounter for central line placement    ED Discharge Orders    None       Tanna Furry, MD Nov 30, 2017 [phone]

## 2017-12-03 DEATH — deceased

## 2017-12-04 ENCOUNTER — Telehealth: Payer: Self-pay

## 2017-12-04 NOTE — Telephone Encounter (Signed)
On 12/04/17 I received a d/c from [facility] (original). The d/c is for burial. The patient is a patient of Doctor Dewaine Oats. The d/c will be taken to [facility] ([address]) for signature.  On 12/05/17 I received the d/c back from Doctor [facility] Bells who signed the d/c for Doctor Dewaine Oats. I got the d/c ready and called the funeral home to let them know the d/c was mailed to vital records per the funeral home request.

## 2018-01-03 NOTE — Discharge Summary (Signed)
Physician Discharge Summary  Patient ID: Kara Hammond MRN: [id number] DOB/AGE: 08-Jul-1926 84 y.o.  Admit date: 11/06/2017 Discharge date: 2017/12/06 Admission Diagnoses: Septic Shock  Discharge Diagnoses:  Active Problems:   Sepsis [facility])  Discharged Condition: Deceased  Hospital Course: 84 year old female with PMH of A.Fib on Coumadin, TIA, Diastolic HF, HTN, HLD, DM, recent RLE cellulitis in July   Presents to ED on 12/24 with progressive lethargy and hypotension. Given 4L NS. LA 1.78. U/A with many bacteria. Given Zosyn and Vancomycin. Per family patient is a DNR/DNI. Admitted to ICU. Over the course of admission CVL placed for administration of pressors. Patient became increasingly unstable on  max dose of Levophed and Vasopressin and hypoxic requiring NRB. Family called to bedside to discuss severity of condition. Family understands and would not want patient to suffer and did not want to escalate care. Pt passed away in 12-07-23 with family at bedside.   Disposition: 20-Expired   Allergies as of 12/06/17   No Known Allergies     Medication List    ASK your doctor about these medications   acarbose 50 MG tablet Commonly known as:  PRECOSE Take 50 mg by mouth 3 (three) times daily.   acetaminophen 500 MG tablet Commonly known as:  TYLENOL Take 500-1,000 mg by mouth every 6 (six) hours as needed for mild pain or headache (sleep).   allopurinol 100 MG tablet Commonly known as:  ZYLOPRIM Take 200 mg by mouth daily.   carvedilol 3.125 MG tablet Commonly known as:  COREG Take 9.375 mg by mouth 2 (two) times daily.   furosemide 40 MG tablet Commonly known as:  LASIX Take 40 mg by mouth 3 (three) times daily.   gabapentin 600 MG tablet Commonly known as:  NEURONTIN Take 600 mg by mouth 2 (two) times daily.   glimepiride 4 MG tablet Commonly known as:  AMARYL Take 4 mg by mouth daily.   multivitamin with minerals Tabs tablet Take 1 tablet by mouth daily.    neomycin-polymyxin b-dexamethasone 3.5-10000-0.1 Susp Commonly known as:  MAXITROL Place 1 drop into both eyes 4 (four) times daily.   pantoprazole 40 MG tablet Commonly known as:  PROTONIX Take 40 mg by mouth daily.   potassium chloride 10 MEQ tablet Commonly known as:  K-DUR,KLOR-CON Take 10 mEq by mouth daily.   pravastatin 20 MG tablet Commonly known as:  PRAVACHOL Take 20 mg by mouth daily.   triamcinolone cream 0.1 % Commonly known as:  KENALOG Apply 1 application topically 2 (two) times daily as needed (rash/itching).   vitamin B-12 1000 MCG tablet Commonly known as:  CYANOCOBALAMIN Take 1,000 mcg by mouth daily.   warfarin 2 MG tablet Commonly known as:  COUMADIN Take 2 mg by mouth See admin instructions. Take 1 tablet (2 mg) by mouth every morning until recheck on 11/27/17        Signed: Konnar Ben 12/06/2017, 8:50 AM

## 2018-01-05 IMAGING — MR MR HEAD W/O CM
9 of 10 series · 37 of 48 positions shown · non-contrast
Comparison: CT head without contrast [DATE] a.m. the same day.

CLINICAL DATA: New onset generalized weakness and confusion
beginning yesterday.

EXAM:
MRI HEAD WITHOUT CONTRAST
TECHNIQUE: Multiplanar, multiecho pulse sequences of the brain and surrounding
structures were obtained without intravenous contrast.

[Series 3: DWI · axial · 3.0mm · 1.09mm/px · z∈[-56,+72]mm · 11 of 88 slices shown (1 of 4)]
[im 1/88]
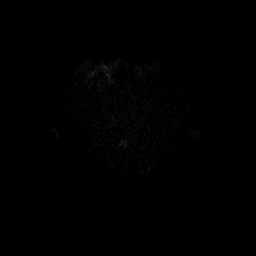
[im 9/88]
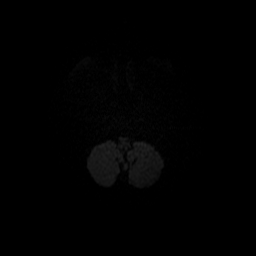
[im 18/88]
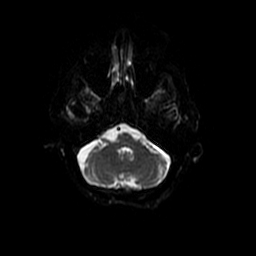
[im 27/88]
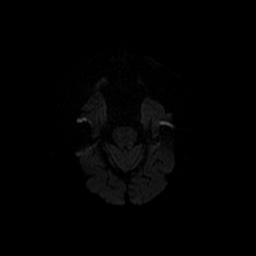
[im 35/88]
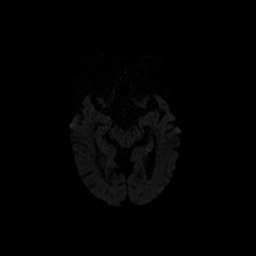
[im 44/88]
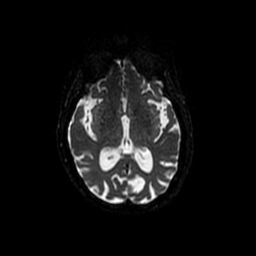
[im 53/88]
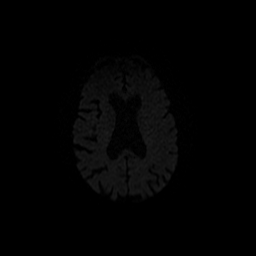
[im 61/88]
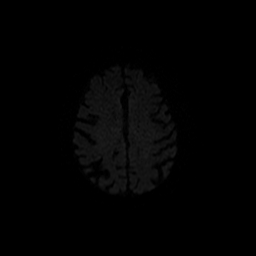
[im 70/88]
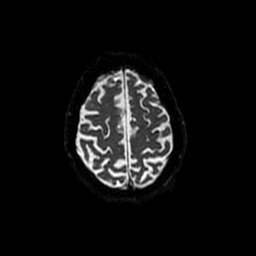
[im 79/88]
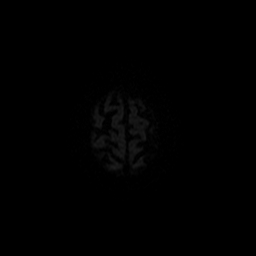
[im 88/88]
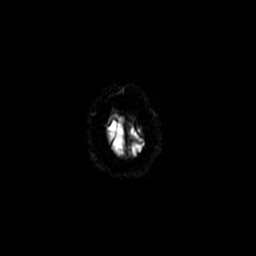

[Series 4: T2 · axial · 5.0mm · 0.43mm/px · z∈[-55,+70]mm · 2 of 22 slices shown (1 of 2)]
[im 1/22]
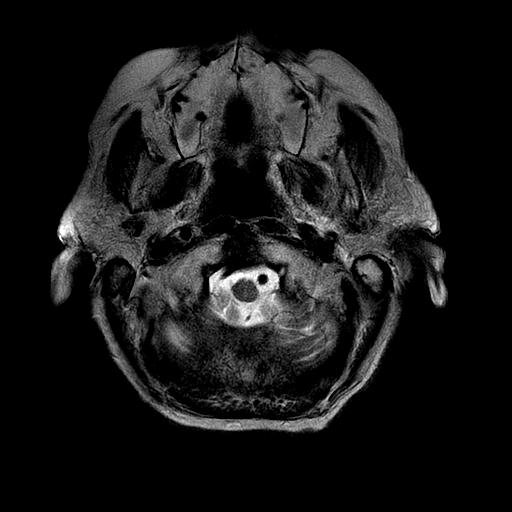
[im 22/22]
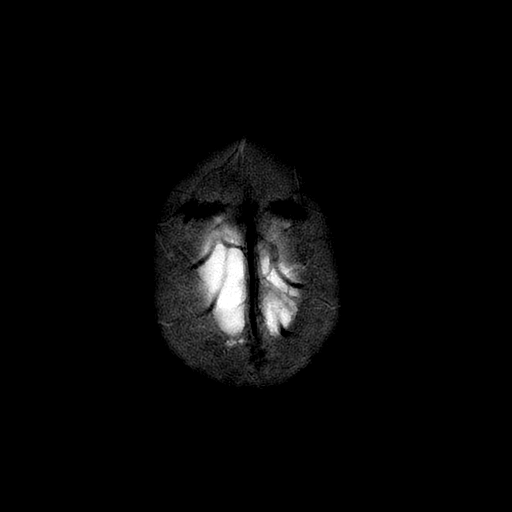

[Series 5: FLAIR · axial · 5.0mm · 0.43mm/px · z∈[-55,+70]mm · 2 of 22 slices shown]
[im 1/22]
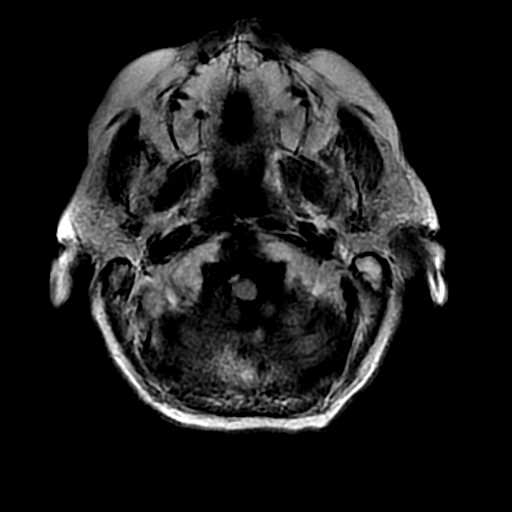
[im 22/22]
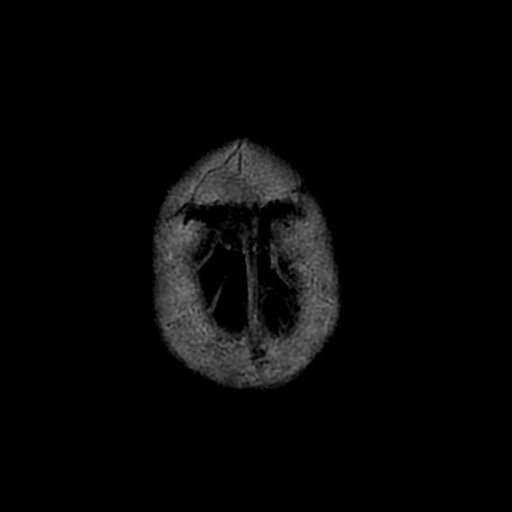

[Series 6: T2 · coronal · 5.0mm · 0.43mm/px · 3 of 25 slices shown (2 of 2)]
[im 1/25]
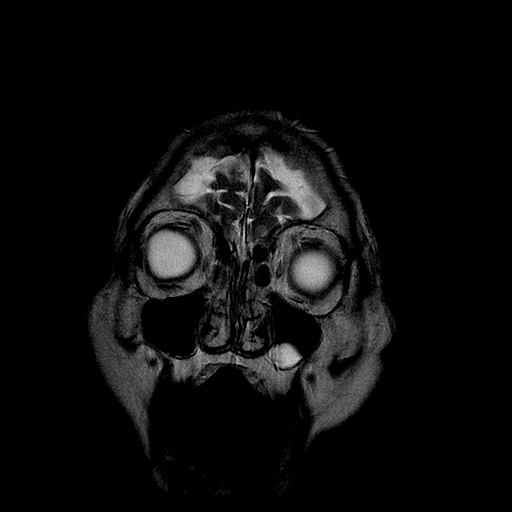
[im 13/25]
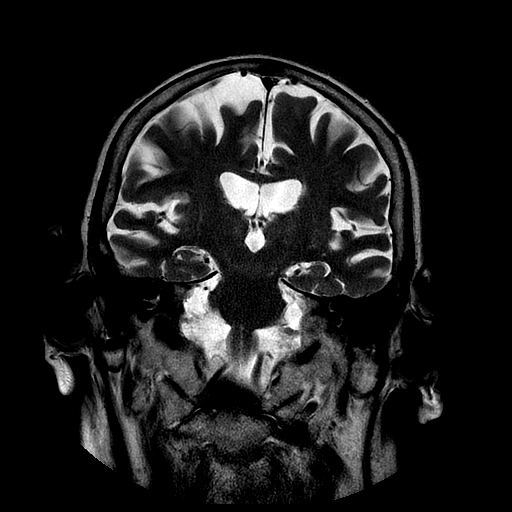
[im 25/25]
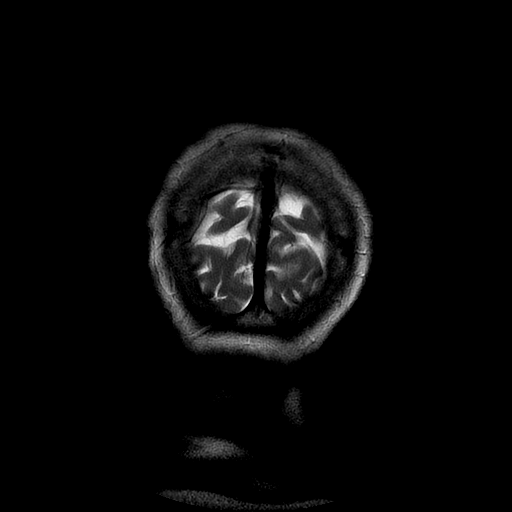

[Series 7: T1 · sagittal · 5.0mm · 0.47mm/px · 3 of 23 slices shown]
[im 1/23]
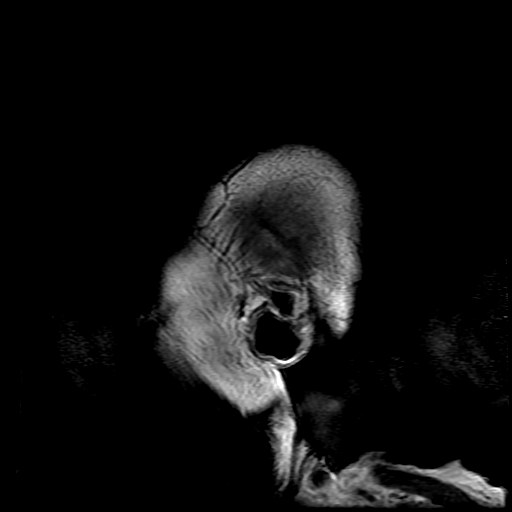
[im 12/23]
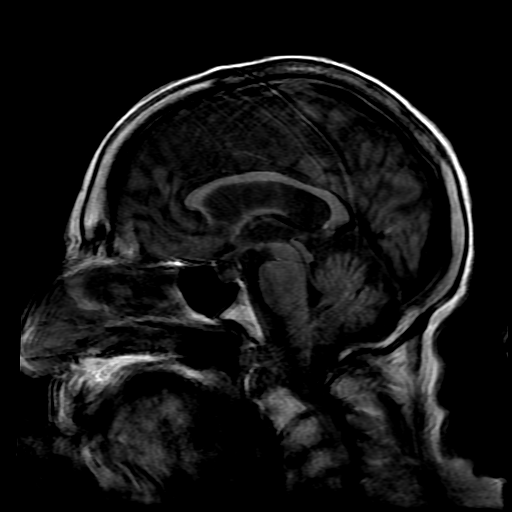
[im 23/23]
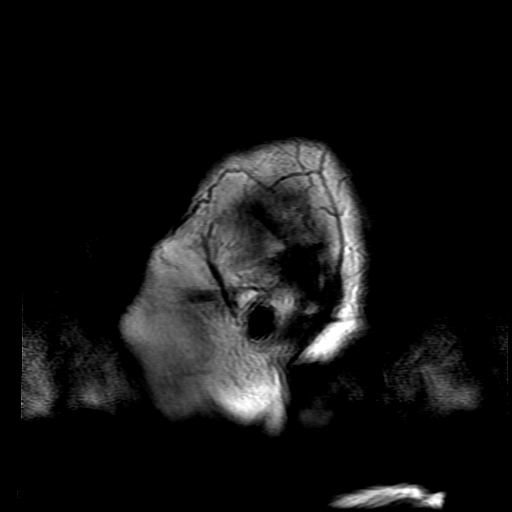

[Series 9: DWI · coronal · 5.0mm · 1.09mm/px · 7 of 62 slices shown (2 of 4)]
[im 1/62]
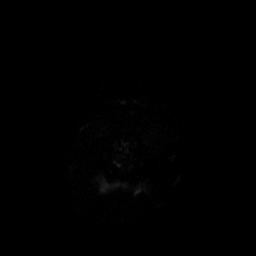
[im 11/62]
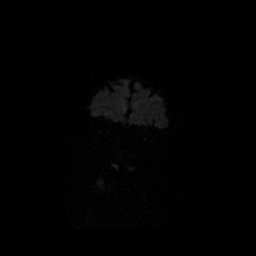
[im 21/62]
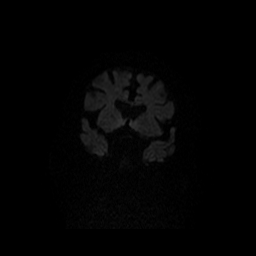
[im 31/62]
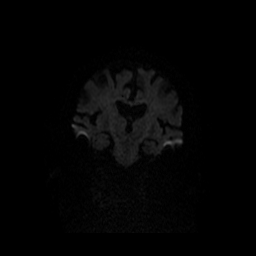
[im 41/62]
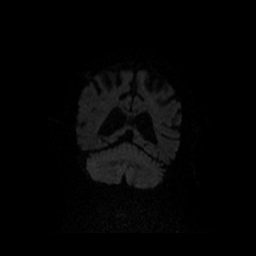
[im 51/62]
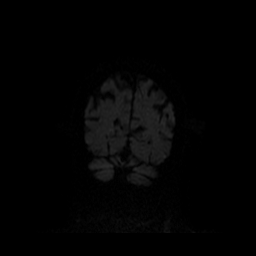
[im 62/62]
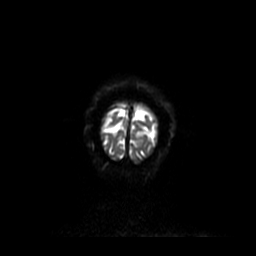

[Series 10: ax mpgr · axial · 5.0mm · 0.43mm/px · 1 of 22 slices shown]
[im 1/22]
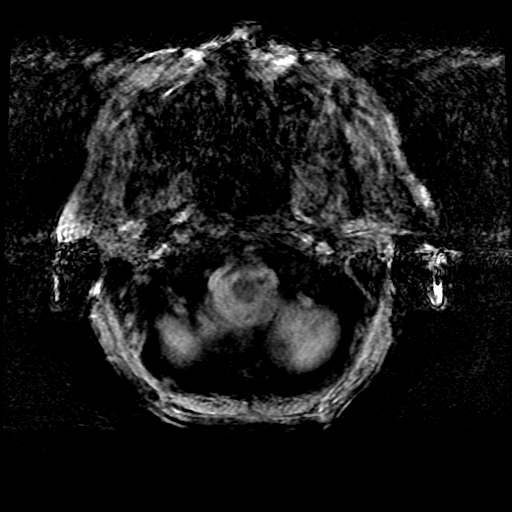

[Series 300: DWI · axial · 3.0mm · 1.09mm/px · z∈[-56,+72]mm · 5 of 44 slices shown (3 of 4)]
[im 1/44]
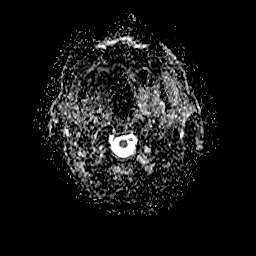
[im 11/44]
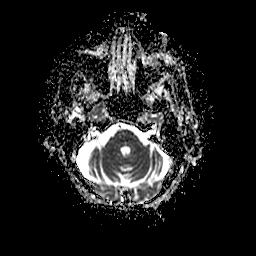
[im 22/44]
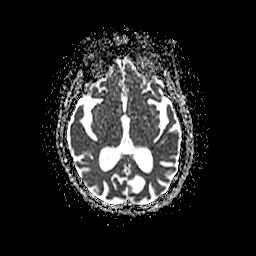
[im 33/44]
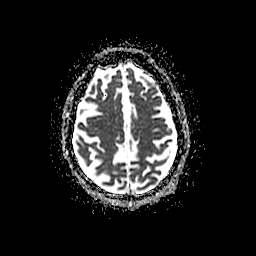
[im 44/44]
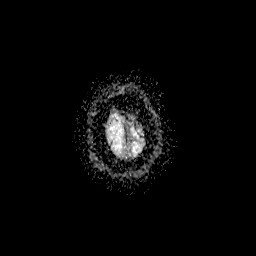

[Series 900: DWI · coronal · 5.0mm · 1.09mm/px · 3 of 31 slices shown (4 of 4)]
[im 1/31]
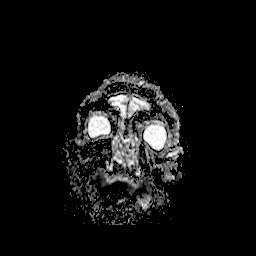
[im 16/31]
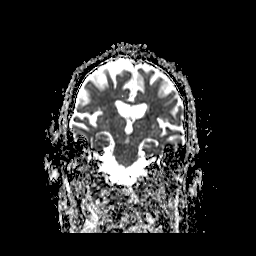
[im 31/31]
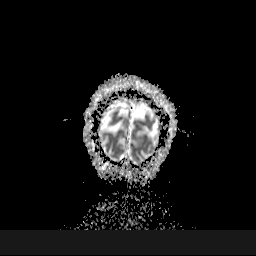

[37 of 48 positions shown; findings below may reference images not displayed]

FINDINGS: Brain: The diffusion-weighted images demonstrate no acute or
subacute infarction. Advanced atrophy is again noted. Relatively
little white matter disease is present. Dilated perivascular spaces
are noted within the basal ganglia. The brainstem and cerebellum are
normal. The calcified lesion at the foramen of image NG likely
reflects choroid. No definite lesion is present otherwise. The study
is mildly degraded by patient motion. Ventricles are proportionate
to the degree of atrophy. No significant extra-axial fluid
collection is present. Next healed flow is present in the major
intracranial arteries.

Vascular: Flow is present in the major intracranial arteries.

Skull and upper cervical spine: The craniocervical junction is
distorted due to patient motion. Upper cervical spine stenosis is
suspected. Midline sagittal structures are otherwise unremarkable.

Sinuses/Orbits: A polyp or mucous retention cyst is present
inferiorly in the left maxillary sinus. The paranasal sinuses and
mastoid air cells are otherwise clear. Bilateral lens replacements
are present. The globes and the orbits are otherwise normal.
IMPRESSION: 1. No acute or focal lesion to explain the patient's new onset
weakness or confusion.
2. Calcified lesion at the foramen of mention D likely reflects
choroid.
3. Advanced generalized atrophy without significant white matter
disease.
4. The degenerative changes with probable stenosis the upper
cervical spine. The images are distorted by patient motion.

## 2018-06-17 IMAGING — DX DG CHEST 1V PORT
1 series · 1 of 1 positions shown · non-contrast
Comparison: Radiograph June 15, 2017.

CLINICAL DATA: Altered mental status.

EXAM:
PORTABLE CHEST 1 VIEW

[chest]
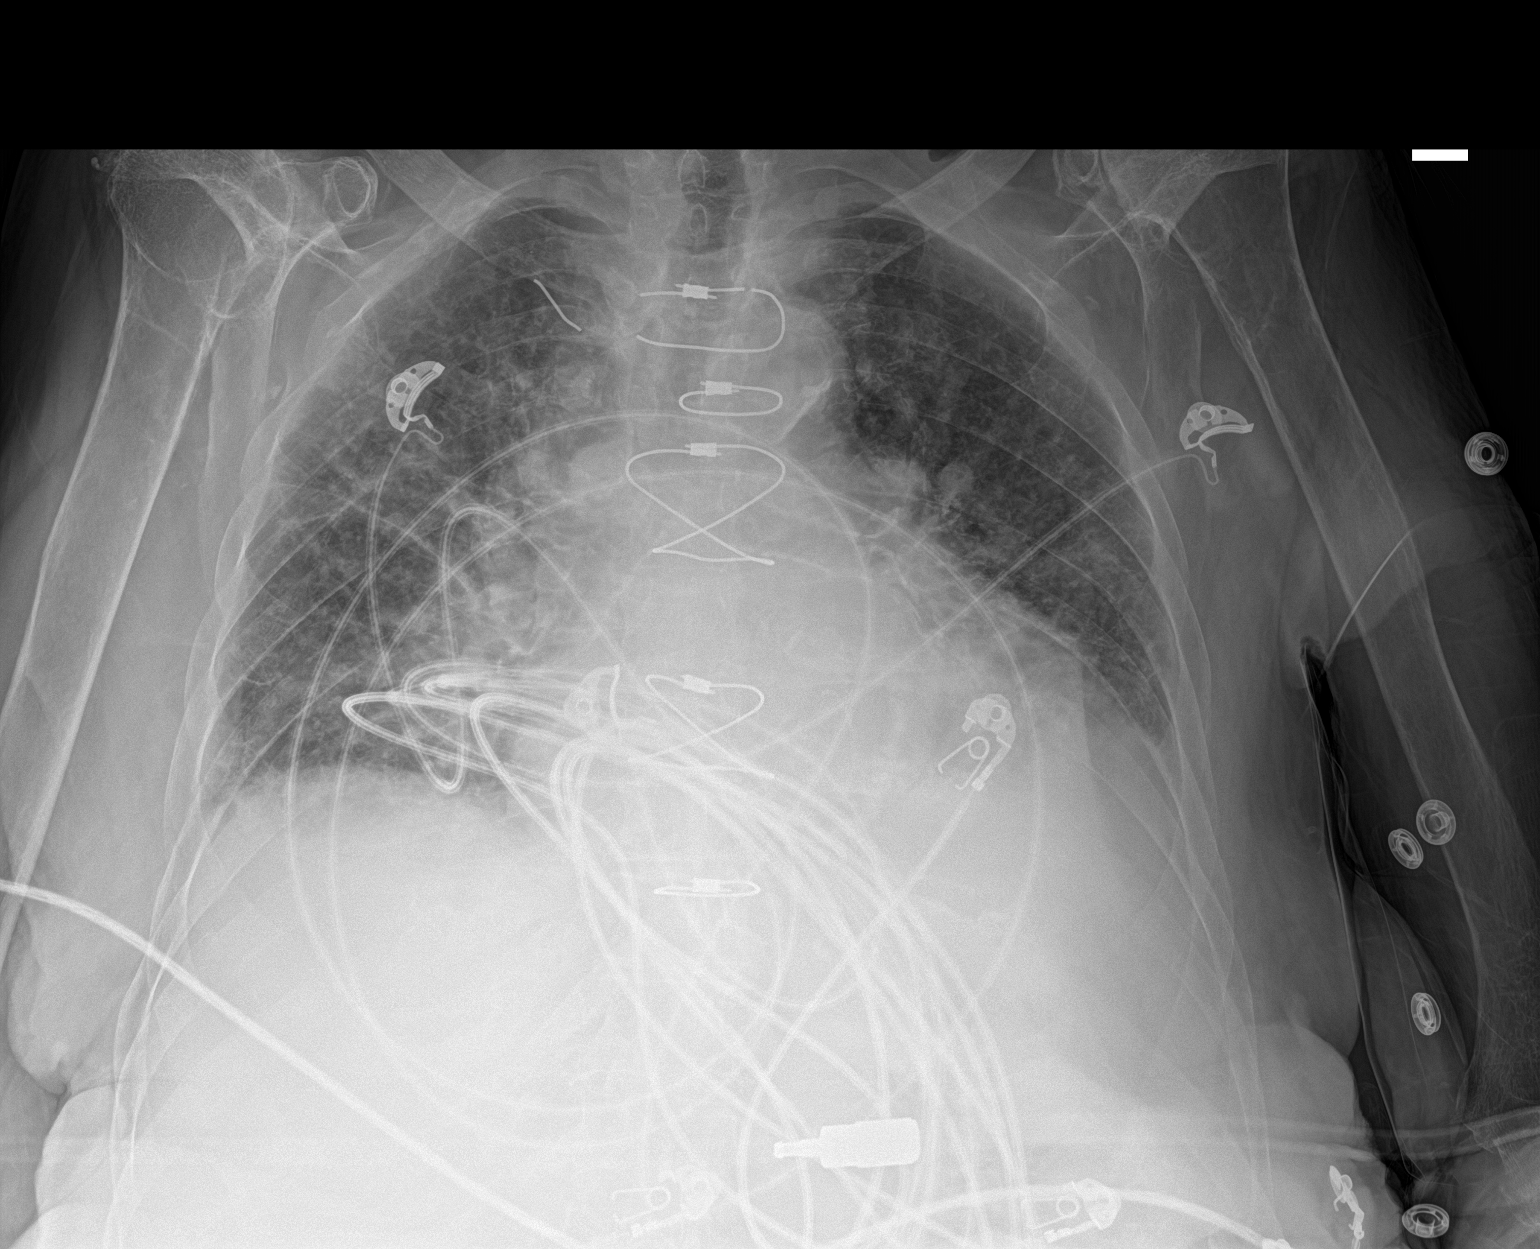

[1 of 1 positions shown; findings below may reference images not displayed]

FINDINGS: Stable cardiomegaly with central pulmonary vascular congestion.
Atherosclerosis of thoracic aorta is noted. Stable reticulonodular
densities are noted throughout both lungs which may represent
chronic interstitial lung disease or scarring, but acute
superimposed edema or inflammation cannot be excluded. Mild left
basilar atelectasis is noted with mild left pleural effusion. Bony
thorax is unremarkable. No pneumothorax is noted.
IMPRESSION: Aortic atherosclerosis. Stable cardiomegaly with central pulmonary
vascular congestion. Stable reticulonodular densities are noted
throughout both lungs which may represent chronic interstitial lung
disease or scarring, but superimposed acute edema or inflammation
cannot be excluded. Mild left basilar atelectasis with mild left
pleural effusion.

## 2018-06-18 IMAGING — DX DG CHEST 1V PORT
1 series · 1 of 1 positions shown · non-contrast
Comparison: Chest radiograph dated 11/25/2017

CLINICAL DATA: 89-year-old female with central line placement.

EXAM:
PORTABLE CHEST 1 VIEW

[chest ap]
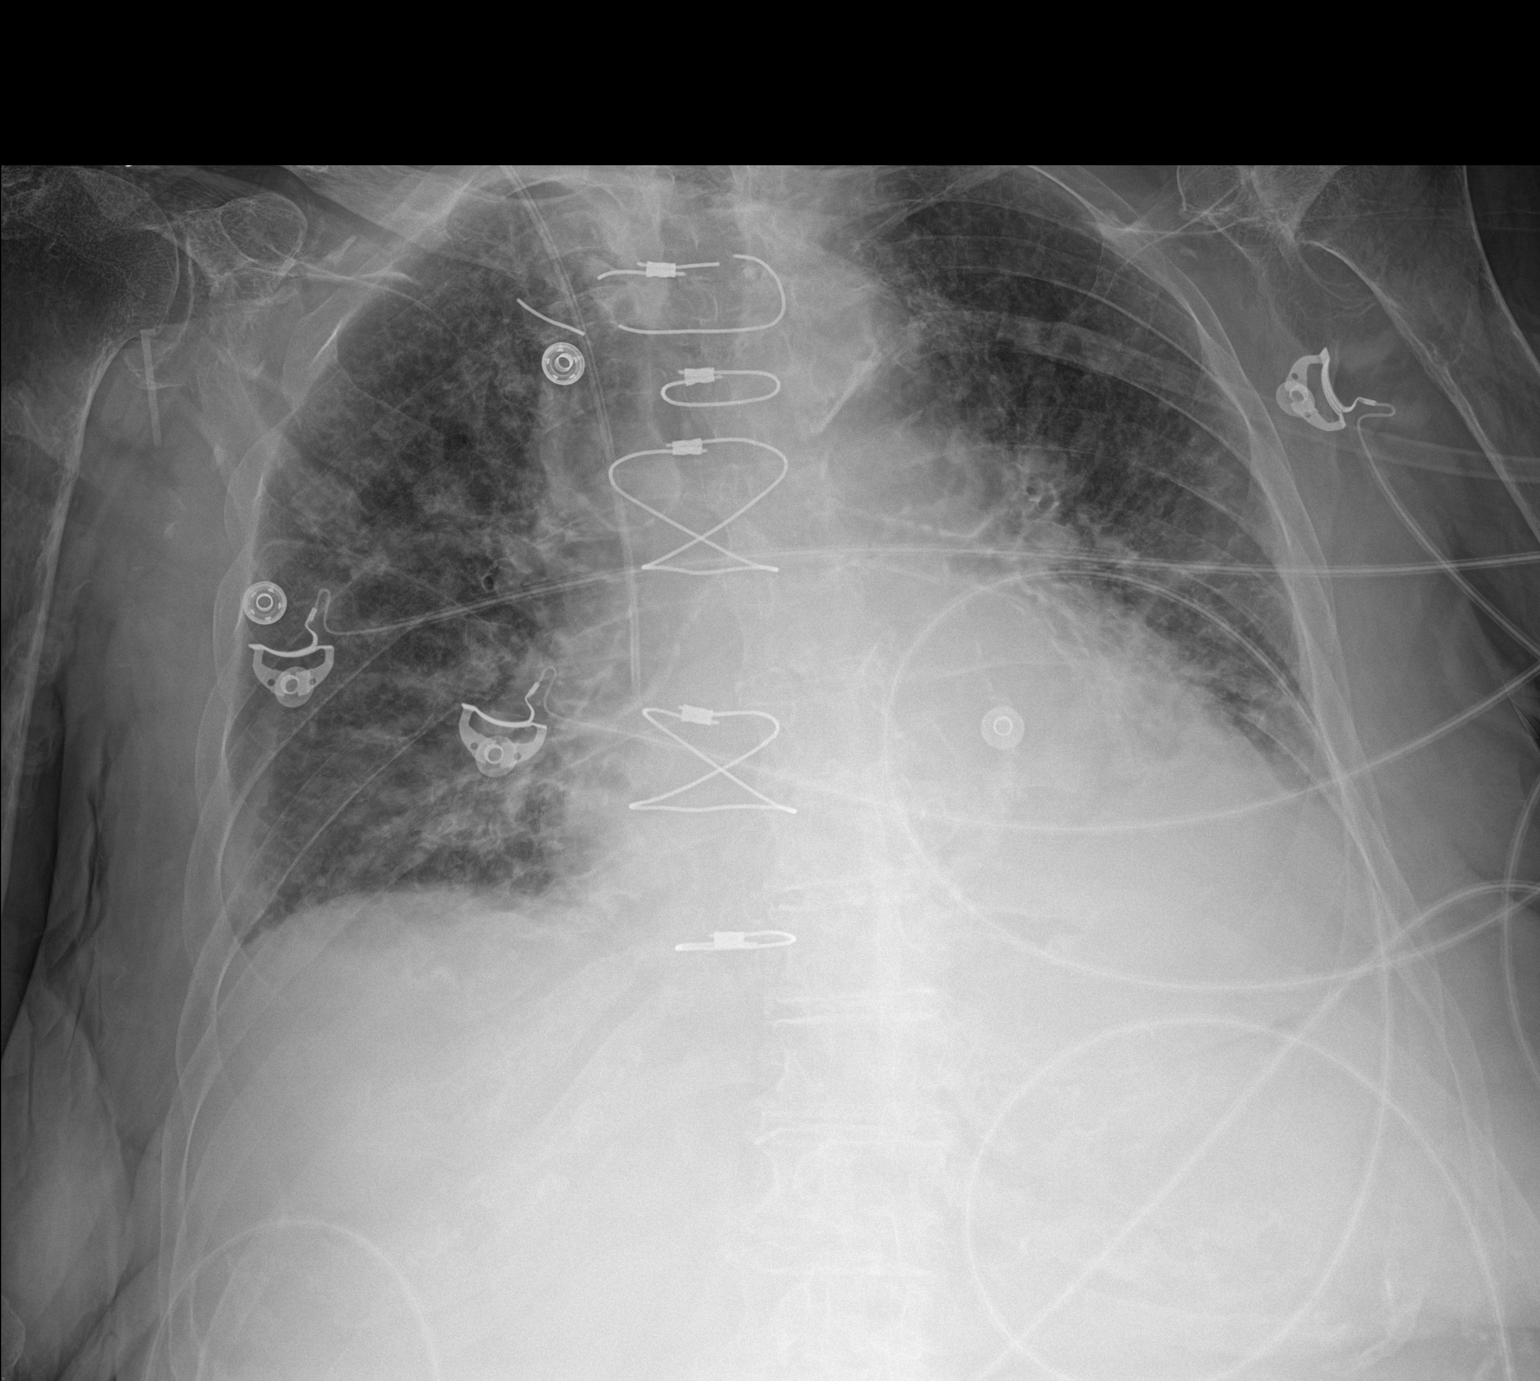

[1 of 1 positions shown; findings below may reference images not displayed]

FINDINGS: Interval placement of a right IJ central line with tip in the region
of the cavoatrial junction. No pneumothorax. Stable cardiomegaly. No
interval change in the appearance of the lungs and left lung base
density compared to the earlier radiograph. Osteopenia with
degenerative changes of the spine. No acute osseous pathology.
IMPRESSION: Interval placement of a right IJ central line with tip in the region
of the cavoatrial junction. No pneumothorax.

No interval change in the appearance of the lungs compared to the
earlier radiograph.
